# Patient Record
Sex: Male | Born: 1950 | Race: Black or African American | Hispanic: No | Marital: Single | Smoking: Current every day smoker
Health system: Southern US, Community
[De-identification: ages and names within clinical notes are randomized; demographics above are authoritative.]

## PROBLEM LIST (undated history)

## (undated) DIAGNOSIS — K219 Gastro-esophageal reflux disease without esophagitis: Secondary | ICD-10-CM

## (undated) DIAGNOSIS — J449 Chronic obstructive pulmonary disease, unspecified: Secondary | ICD-10-CM

## (undated) DIAGNOSIS — M1990 Unspecified osteoarthritis, unspecified site: Secondary | ICD-10-CM

## (undated) DIAGNOSIS — J111 Influenza due to unidentified influenza virus with other respiratory manifestations: Secondary | ICD-10-CM

## (undated) DIAGNOSIS — F17200 Nicotine dependence, unspecified, uncomplicated: Secondary | ICD-10-CM

## (undated) DIAGNOSIS — G4730 Sleep apnea, unspecified: Secondary | ICD-10-CM

## (undated) HISTORY — DX: Unspecified osteoarthritis, unspecified site: M19.90

## (undated) HISTORY — DX: Chronic obstructive pulmonary disease, unspecified: J44.9

## (undated) HISTORY — PX: NO PAST SURGERIES: SHX2092

## (undated) HISTORY — DX: Sleep apnea, unspecified: G47.30

## (undated) HISTORY — DX: Nicotine dependence, unspecified, uncomplicated: F17.200

## (undated) HISTORY — DX: Influenza due to unidentified influenza virus with other respiratory manifestations: J11.1

## (undated) HISTORY — DX: Gastro-esophageal reflux disease without esophagitis: K21.9

---

## 2017-07-16 ENCOUNTER — Emergency Department
Admission: EM | Admit: 2017-07-16 | Discharge: 2017-07-16 | Disposition: A | Discharge status: Home / Self Care | Payer: Medicare Other | Attending: Emergency Medicine | Admitting: Emergency Medicine

## 2017-07-16 ENCOUNTER — Encounter: Payer: Self-pay | Admitting: Emergency Medicine

## 2017-07-16 DIAGNOSIS — R220 Localized swelling, mass and lump, head: Secondary | ICD-10-CM | POA: Diagnosis present

## 2017-07-16 DIAGNOSIS — F17200 Nicotine dependence, unspecified, uncomplicated: Secondary | ICD-10-CM | POA: Insufficient documentation

## 2017-07-16 DIAGNOSIS — K047 Periapical abscess without sinus: Secondary | ICD-10-CM

## 2017-07-16 MED ORDER — LIDOCAINE-EPINEPHRINE 2 %-1:100000 IJ SOLN
INTRAMUSCULAR | Status: AC
  Administered 2017-07-16: 1.7 mL
  Filled 2017-07-16: qty 1.7

## 2017-07-16 MED ORDER — LIDOCAINE-EPINEPHRINE 2 %-1:100000 IJ SOLN
1.7000 mL | Freq: Once | INTRAMUSCULAR | Status: AC
  Administered 2017-07-16: 1.7 mL

## 2017-07-16 MED ORDER — HYDROCODONE-ACETAMINOPHEN 5-325 MG PO TABS
2.0000 | ORAL_TABLET | Freq: Once | ORAL | Status: AC
  Administered 2017-07-16: 2 via ORAL
  Filled 2017-07-16: qty 2

## 2017-07-16 MED ORDER — CLINDAMYCIN HCL 300 MG PO CAPS: 300.0000 mg | ORAL_CAPSULE | Freq: Four times a day (QID) | ORAL | 0 refills | Status: AC

## 2017-07-16 MED ORDER — LIDOCAINE-EPINEPHRINE 2 %-1:100000 IJ SOLN
INTRAMUSCULAR | Status: AC
  Filled 2017-07-16: qty 1.7

## 2017-07-16 MED ORDER — CLINDAMYCIN HCL 150 MG PO CAPS
300.0000 mg | ORAL_CAPSULE | Freq: Once | ORAL | Status: AC
  Administered 2017-07-16: 300 mg via ORAL
  Filled 2017-07-16: qty 2

## 2017-07-16 MED ORDER — HYDROCODONE-ACETAMINOPHEN 5-325 MG PO TABS: 1.0000 | ORAL_TABLET | ORAL | 0 refills | Status: AC | PRN

## 2017-07-16 NOTE — ED Triage Notes (Signed)
Pt to ed with c/o pain to right side of face and facial swelling, reports started abx 2 days ago.

## 2017-07-16 NOTE — ED Provider Notes (Signed)
[facility] Emergency Department Provider Note  Time seen: 7:18 PM  I have reviewed the triage vital signs and the nursing notes.   HISTORY  Chief Complaint Facial Swelling    HPI Juan [name] is a 66 y.o. male Who presents to the emergency department for right facial swelling. According to the patient for the past few days he has been experiencing pain in his right lower molar. Went to urgent care and was prescribed penicillin yesterday. Patient states he awoke this morning with significant swelling to the right lower face which appeared to worsen today so he came to the ER for evaluation. Denies any trouble swallowing or breathing besides pain with chewing. Denies fever. Denies vomiting.describes his pain as moderate. Dull pain.  History reviewed. No pertinent past medical history.  There are no active problems to display for this patient.   History reviewed. No pertinent surgical history.  Prior to Admission medications   Not on File    No Known Allergies  No family history on file.  Social History Social History  Substance Use Topics  . Smoking status: Current Every Day Smoker  . Smokeless tobacco: Never Used  . Alcohol use Yes    Review of Systems Constitutional: Negative for fever. ENT: right lower molar pain/right lower face pain and swelling Cardiovascular: Negative for chest pain. Respiratory: Negative for shortness of breath. Gastrointestinal: Negative for abdominal pain, vomiting Musculoskeletal: Negative for back pain. Neurological: Negative for headache All other ROS negative  ____________________________________________   PHYSICAL EXAM:  VITAL SIGNS: ED Triage Vitals  Enc Vitals Group     BP --      Pulse --      Resp 07/16/17 1808 18     Temp --      Temp Source 07/16/17 1808 Oral     SpO2 --      Weight 07/16/17 1808 165 lb (74.8 kg)     Height 07/16/17 1808 5\' 8"  (1.727 m)     Head Circumference --      Peak Flow  --      Pain Score 07/16/17 1807 2     Pain Loc --      Pain Edu? --      Excl. in [place]? --     Constitutional: Alert and oriented. Well appearing and in no distress. Eyes: Normal exam ENT   Head: Normocephalic and atraumatic.   Mouth/Throat: Mucous membranes are moist.patient has tenderness to the right lower molar. Moderate swelling to the right lower face. Floor the mouth is soft and nontender. Does not appear to extend below the mandibular line. Cardiovascular: Normal rate, regular rhythm. No murmur Respiratory: Normal respiratory effort without tachypnea nor retractions. Breath sounds are clear  Gastrointestinal: Soft and nontender. No distention Musculoskeletal: Nontender with normal range of motion in all extremities.  Neurologic:  Normal speech and language. No gross focal neurologic deficits Skin:  Skin is warm, dry and intact.  Psychiatric: Mood and affect are normal.   ____________________________________________   INITIAL IMPRESSION / ASSESSMENT AND PLAN / ED COURSE  Pertinent labs & imaging results that were available during my care of the patient were reviewed by me and considered in my medical decision making (see chart for details).  patient presents to the emergency department for right facial swelling after several days of right molar pain. Exam is very consistent with dental abscess. I performed an inferior alveolar block attempted to drain with needle aspiration, unable to drain any pus from  the area. I ultrasounded the area at bedside and appears to show more of a cellulitis picture but no discernible pocket of exudate that could be drained yet. However as the patient now has a widely dental abscess we will place the patient on clindamycin opposed to penicillin. We will discharge with pain medication. The patient states they will call the dentist tomorrow morning. I discussed return precautions for any worsening swelling fever or vomiting unable to keep down  antibiotics.  ____________________________________________   FINAL CLINICAL IMPRESSION(S) / ED DIAGNOSES  right facial swelling    Harvest Dark, MD 07/16/17 1921

## 2017-07-16 NOTE — Discharge Instructions (Signed)
return to the emergency department for any increase in facial swelling, any difficulty breathing, fever, or if you are unable to keep down antibiotics due to vomiting.

## 2017-07-30 ENCOUNTER — Emergency Department
Admission: EM | Admit: 2017-07-30 | Discharge: 2017-07-30 | Disposition: A | Discharge status: Home / Self Care | Payer: Medicare Other | Attending: Emergency Medicine | Admitting: Emergency Medicine

## 2017-07-30 ENCOUNTER — Encounter: Payer: Self-pay | Admitting: Emergency Medicine

## 2017-07-30 DIAGNOSIS — F17200 Nicotine dependence, unspecified, uncomplicated: Secondary | ICD-10-CM | POA: Diagnosis not present

## 2017-07-30 DIAGNOSIS — R109 Unspecified abdominal pain: Secondary | ICD-10-CM

## 2017-07-30 DIAGNOSIS — K2970 Gastritis, unspecified, without bleeding: Secondary | ICD-10-CM

## 2017-07-30 LAB — CBC
HCT: 38.2 % — ABNORMAL LOW (ref 40.0–52.0)
HEMOGLOBIN: 13.6 g/dL (ref 13.0–18.0)
MCH: 34.4 pg — AB (ref 26.0–34.0)
MCHC: 35.6 g/dL (ref 32.0–36.0)
MCV: 96.5 fL (ref 80.0–100.0)
Platelets: 220 10*3/uL (ref 150–440)
RBC: 3.96 MIL/uL — AB (ref 4.40–5.90)
RDW: 15 % — ABNORMAL HIGH (ref 11.5–14.5)
WBC: 7 10*3/uL (ref 3.8–10.6)

## 2017-07-30 LAB — URINALYSIS, COMPLETE (UACMP) WITH MICROSCOPIC
Bacteria, UA: NONE SEEN
Bilirubin Urine: NEGATIVE
Glucose, UA: NEGATIVE mg/dL
KETONES UR: NEGATIVE mg/dL
Leukocytes, UA: NEGATIVE
Nitrite: NEGATIVE
PROTEIN: NEGATIVE mg/dL
SQUAMOUS EPITHELIAL / LPF: NONE SEEN
Specific Gravity, Urine: 1.02 (ref 1.005–1.030)
pH: 6 (ref 5.0–8.0)

## 2017-07-30 LAB — COMPREHENSIVE METABOLIC PANEL
ALBUMIN: 3.5 g/dL (ref 3.5–5.0)
ALT: 26 U/L (ref 17–63)
ANION GAP: 5 (ref 5–15)
AST: 21 U/L (ref 15–41)
Alkaline Phosphatase: 60 U/L (ref 38–126)
BUN: 18 mg/dL (ref 6–20)
CHLORIDE: 106 mmol/L (ref 101–111)
CO2: 27 mmol/L (ref 22–32)
CREATININE: 0.68 mg/dL (ref 0.61–1.24)
Calcium: 9.4 mg/dL (ref 8.9–10.3)
GFR calc non Af Amer: >60 mL/min (ref >60)
GLUCOSE: 126 mg/dL — AB (ref 65–99)
Potassium: 3.7 mmol/L (ref 3.5–5.1)
SODIUM: 138 mmol/L (ref 135–145)
Total Bilirubin: 0.6 mg/dL (ref 0.3–1.2)
Total Protein: 7.1 g/dL (ref 6.5–8.1)

## 2017-07-30 LAB — LIPASE, BLOOD: LIPASE: 25 U/L (ref 11–51)

## 2017-07-30 MED ORDER — SUCRALFATE 1 G PO TABS: 1.0000 g | ORAL_TABLET | Freq: Four times a day (QID) | ORAL | 0 refills | Status: DC

## 2017-07-30 MED ORDER — FAMOTIDINE 40 MG PO TABS
40.0000 mg | ORAL_TABLET | Freq: Every evening | ORAL | 1 refills | Status: DC
Start: 2017-07-30 — End: 2018-09-20

## 2017-07-30 NOTE — ED Triage Notes (Signed)
Pt reports generalized abdominal pain every time he eats for several months. Pt reports feeling a gnawing sensation in his abdomen after eating. Denies NVD. Denies blood in stool. Pt ambulatory to triage, no apparent distress noted. Pt reports pain is becoming more frequent so he decided to come today.

## 2017-07-30 NOTE — Discharge Instructions (Signed)
Please seek medical attention for any high fevers, chest pain, shortness of breath, change in behavior, persistent vomiting, bloody stool or any other new or concerning symptoms.  

## 2017-07-30 NOTE — ED Provider Notes (Signed)
[facility] Emergency Department Provider Note  ____________________________________________   I have reviewed the triage vital signs and the nursing notes.   HISTORY  Chief Complaint Abdominal Pain   History limited by: Not Limited   HPI Juan [name] is a 66 y.o. male who presents to the emergency department today because of concerns for abdominal pain. Is located in the upper mid abdomen. It started a few days ago. It is worse after he eats. He feels like it is a gnawing within his stomach. It will become severe after he notes. He states he has started antibiotics recently for a dental infection and has been lying down shortly after taking them and has not been drinking water with them as are the instructions on the bottle. He denies similar pain in the past. He denies any fevers. Denies any change in defecation.  History reviewed. No pertinent past medical history.  There are no active problems to display for this patient.   No past surgical history on file.  Prior to Admission medications   Medication Sig Start Date End Date Taking? Authorizing Provider  HYDROcodone-acetaminophen (NORCO/VICODIN) 5-325 MG tablet Take 1 tablet by mouth every 4 (four) hours as needed for moderate pain. 07/16/17 07/16/18  Harvest Dark, MD    Allergies Patient has no known allergies.  No family history on file.  Social History Social History  Substance Use Topics  . Smoking status: Current Every Day Smoker  . Smokeless tobacco: Never Used  . Alcohol use Yes    Review of Systems Constitutional: No fever/chills Eyes: No visual changes. ENT: No sore throat. Cardiovascular: Denies chest pain. Respiratory: Denies shortness of breath. Gastrointestinal: Positive for abdominal pain.  Genitourinary: Negative for dysuria. Musculoskeletal: Negative for back pain. Skin: Negative for rash. Neurological: Negative for headaches, focal weakness or  numbness.  ____________________________________________   PHYSICAL EXAM:  VITAL SIGNS: ED Triage Vitals  Enc Vitals Group     BP 07/30/17 1138 109/73     Pulse Rate 07/30/17 1138 72     Resp 07/30/17 1138 18     Temp 07/30/17 1138 98.3 F (36.8 C)     Temp Source 07/30/17 1138 Oral     SpO2 07/30/17 1138 98 %     Weight 07/30/17 1140 165 lb (74.8 kg)     Height 07/30/17 1140 5' 8.5" (1.74 m)     Head Circumference --      Peak Flow --      Pain Score 07/30/17 1139 5    Constitutional: Alert and oriented. Well appearing and in no distress. Eyes: Conjunctivae are normal.  ENT   Head: Normocephalic and atraumatic.   Nose: No congestion/rhinnorhea.   Mouth/Throat: Mucous membranes are moist.   Neck: No stridor. Hematological/Lymphatic/Immunilogical: No cervical lymphadenopathy. Cardiovascular: Normal rate, regular rhythm.  No murmurs, rubs, or gallops.  Respiratory: Normal respiratory effort without tachypnea nor retractions. Breath sounds are clear and equal bilaterally. No wheezes/rales/rhonchi. Gastrointestinal: Soft and non tender. No rebound. No guarding.  Genitourinary: Deferred Musculoskeletal: Normal range of motion in all extremities. No lower extremity edema. Neurologic:  Normal speech and language. No gross focal neurologic deficits are appreciated.  Skin:  Skin is warm, dry and intact. No rash noted. Psychiatric: Mood and affect are normal. Speech and behavior are normal. Patient exhibits appropriate insight and judgment.  ____________________________________________    LABS (pertinent positives/negatives)  CBC WBC 7.0 Hgb 13.6 Platelets 220  CMP Cr 0.68 K 3.7 Alk phos 60 AST 21 ALT  26 T bili 0.6 Lipase 25  ____________________________________________   EKG  None  ____________________________________________     RADIOLOGY  None ____________________________________________   PROCEDURES  Procedures  ____________________________________________   INITIAL IMPRESSION / ASSESSMENT AND PLAN / ED COURSE  Pertinent labs & imaging results that were available during my care of the patient were reviewed by me and considered in my medical decision making (see chart for details).  patient presents to the emergency department today with concerns for abdominal pain. On exam his abdomen is benign. No concerning findings on blood work. At this point I think gastritis likely given her recent new antibiotic use. Will trial patient on antacids and sucralfate. Discussed with patient return precautions. Did discuss possibility of gallbladder disease however given lack of tenderness in the right upper quadrant and no concerning findings on blood work that this is a lesser probability.  ____________________________________________   FINAL CLINICAL IMPRESSION(S) / ED DIAGNOSES  Final diagnoses:  Abdominal pain, unspecified abdominal location  Gastritis, presence of bleeding unspecified, unspecified chronicity, unspecified gastritis type     Note: This dictation was prepared with Dragon dictation. Any transcriptional errors that result from this process are unintentional     Nance Pear, MD 07/30/17 1526

## 2018-01-08 ENCOUNTER — Telehealth: Payer: Self-pay | Admitting: *Deleted

## 2018-01-08 DIAGNOSIS — Z122 Encounter for screening for malignant neoplasm of respiratory organs: Secondary | ICD-10-CM

## 2018-01-08 DIAGNOSIS — Z87891 Personal history of nicotine dependence: Secondary | ICD-10-CM

## 2018-01-08 NOTE — Telephone Encounter (Signed)
Received referral for initial lung cancer screening scan. Contacted patient and obtained smoking history,(current, 39.75 pack year) as well as answering questions related to screening process. Patient denies signs of lung cancer such as weight loss or hemoptysis. Patient denies comorbidity that would prevent curative treatment if lung cancer were found. Patient is scheduled for shared decision making visit and CT scan on 01/28/18.

## 2018-01-25 ENCOUNTER — Encounter: Payer: Self-pay | Admitting: *Deleted

## 2018-01-28 ENCOUNTER — Ambulatory Visit
Admission: RE | Admit: 2018-01-28 | Discharge: 2018-01-28 | Disposition: A | Discharge status: Home / Self Care | Payer: Medicare Other | Source: Ambulatory Visit | Attending: Nurse Practitioner | Admitting: Nurse Practitioner

## 2018-01-28 ENCOUNTER — Inpatient Hospital Stay: Payer: Medicare Other | Attending: Oncology | Admitting: Oncology

## 2018-01-28 DIAGNOSIS — Z122 Encounter for screening for malignant neoplasm of respiratory organs: Secondary | ICD-10-CM | POA: Diagnosis not present

## 2018-01-28 DIAGNOSIS — Z87891 Personal history of nicotine dependence: Secondary | ICD-10-CM

## 2018-01-28 DIAGNOSIS — R918 Other nonspecific abnormal finding of lung field: Secondary | ICD-10-CM | POA: Diagnosis not present

## 2018-01-28 DIAGNOSIS — I700 Atherosclerosis of aorta: Secondary | ICD-10-CM | POA: Diagnosis not present

## 2018-01-28 DIAGNOSIS — F17210 Nicotine dependence, cigarettes, uncomplicated: Secondary | ICD-10-CM

## 2018-01-28 NOTE — Progress Notes (Signed)
In accordance with CMS guidelines, patient has met eligibility criteria including age, absence of signs or symptoms of lung cancer.  Social History   Tobacco Use  . Smoking status: Current Every Day Smoker    Packs/day: 0.75    Years: 53.00    Pack years: 39.75  . Smokeless tobacco: Never Used  Substance Use Topics  . Alcohol use: Yes  . Drug use: No     A shared decision-making session was conducted prior to the performance of CT scan. This includes one or more decision aids, includes benefits and harms of screening, follow-up diagnostic testing, over-diagnosis, false positive rate, and total radiation exposure.  Counseling on the importance of adherence to annual lung cancer LDCT screening, impact of co-morbidities, and ability or willingness to undergo diagnosis and treatment is imperative for compliance of the program.  Counseling on the importance of continued smoking cessation for former smokers; the importance of smoking cessation for current smokers, and information about tobacco cessation interventions have been given to patient including [place] and 1800 quit  programs.  Written order for lung cancer screening with LDCT has been given to the patient and any and all questions have been answered to the best of my abilities.   Yearly follow up will be coordinated by Burgess Estelle, Thoracic Navigator.  Faythe Casa, NP 01/28/2018 9:42 AM

## 2018-01-29 ENCOUNTER — Telehealth: Payer: Self-pay | Admitting: *Deleted

## 2018-01-29 ENCOUNTER — Other Ambulatory Visit: Payer: Self-pay | Admitting: Nurse Practitioner

## 2018-01-29 DIAGNOSIS — Z87891 Personal history of nicotine dependence: Secondary | ICD-10-CM

## 2018-01-29 NOTE — Telephone Encounter (Signed)
Notified patient of LDCT lung cancer screening program results with recommendation for 3 month follow up imaging. Also notified of incidental findings noted below and is encouraged to discuss further with PCP who will receive a copy of this note and/or the CT report. Patient verbalizes understanding. PCP was notified by Beckey Rutter NP today.  IMPRESSION: 1. Lung-RADS 0-S, incomplete. Patchy peribronchovascular consolidation, nodularity and ground-glass opacity throughout the anterior right lower lobe, most compatible with bronchopneumonia. Repeat low-dose chest CT without contrast in 3 months (please use the following order, "CT CHEST LCS NODULE FOLLOW-UP W/O CM") is recommended. 2. The "S" modifier above refers to potentially clinically significant non lung cancer related findings. Specifically, mild right paratracheal adenopathy, nonspecific, which can also be reassessed on follow-up chest CT in 3 months.  Aortic Atherosclerosis (ICD10-I70.0).  These results will be called to the ordering clinician or representative by the Radiologist Assistant, and communication documented in the PACS or zVision Dashboard.

## 2018-01-29 NOTE — Progress Notes (Signed)
Low-dose CT lung screening revealed patchy peribronchovascular consolidation, nodularity, and groundglass opacity throughout the anterior right lower lobe most compatible with bronchopneumonia.  Recommendation for repeat low-dose chest CT without contrast in 3 months.  Discussed finding with patient's primary care doctor Register.  Dr. Gwendolyn Fill to discuss findings with patient and treat.  I have placed order for follow-up scan in 3 months.  Will fax copy of report to PCP.

## 2018-04-22 ENCOUNTER — Telehealth: Payer: Self-pay | Admitting: *Deleted

## 2018-04-22 DIAGNOSIS — R918 Other nonspecific abnormal finding of lung field: Secondary | ICD-10-CM

## 2018-04-22 DIAGNOSIS — Z87891 Personal history of nicotine dependence: Secondary | ICD-10-CM

## 2018-04-22 DIAGNOSIS — Z122 Encounter for screening for malignant neoplasm of respiratory organs: Secondary | ICD-10-CM

## 2018-04-22 NOTE — Telephone Encounter (Signed)
Notified patient that lung cancer screening short term follow up CT scan is due currently or will be in near future. Confirmed that patient is within the age range of 55-77, and asymptomatic, (no signs or symptoms of lung cancer). Patient denies illness that would prevent curative treatment for lung cancer if found. Verified smoking history, (current, 39.75 pack year). The shared decision making visit was done 01/28/18. Patient is agreeable for CT scan being scheduled.

## 2018-05-06 ENCOUNTER — Ambulatory Visit
Admission: RE | Admit: 2018-05-06 | Discharge: 2018-05-06 | Disposition: A | Discharge status: Home / Self Care | Payer: Medicare Other | Source: Ambulatory Visit | Attending: Nurse Practitioner | Admitting: Nurse Practitioner

## 2018-05-06 DIAGNOSIS — I700 Atherosclerosis of aorta: Secondary | ICD-10-CM | POA: Insufficient documentation

## 2018-05-06 DIAGNOSIS — J432 Centrilobular emphysema: Secondary | ICD-10-CM | POA: Insufficient documentation

## 2018-05-06 DIAGNOSIS — Z87891 Personal history of nicotine dependence: Secondary | ICD-10-CM | POA: Diagnosis present

## 2018-05-06 DIAGNOSIS — Z122 Encounter for screening for malignant neoplasm of respiratory organs: Secondary | ICD-10-CM

## 2018-05-06 DIAGNOSIS — J438 Other emphysema: Secondary | ICD-10-CM | POA: Diagnosis not present

## 2018-05-06 DIAGNOSIS — R918 Other nonspecific abnormal finding of lung field: Secondary | ICD-10-CM

## 2018-05-13 ENCOUNTER — Encounter: Payer: Self-pay | Admitting: *Deleted

## 2018-07-17 ENCOUNTER — Encounter: Payer: Self-pay | Admitting: Emergency Medicine

## 2018-07-17 ENCOUNTER — Other Ambulatory Visit: Payer: Self-pay

## 2018-07-17 ENCOUNTER — Emergency Department: Payer: No Typology Code available for payment source

## 2018-07-17 ENCOUNTER — Emergency Department
Admission: EM | Admit: 2018-07-17 | Discharge: 2018-07-18 | Disposition: A | Discharge status: Home / Self Care | Payer: No Typology Code available for payment source | Attending: Student in an Organized Health Care Education/Training Program | Admitting: Student in an Organized Health Care Education/Training Program

## 2018-07-17 DIAGNOSIS — F17210 Nicotine dependence, cigarettes, uncomplicated: Secondary | ICD-10-CM | POA: Insufficient documentation

## 2018-07-17 DIAGNOSIS — R1084 Generalized abdominal pain: Secondary | ICD-10-CM | POA: Diagnosis not present

## 2018-07-17 DIAGNOSIS — Y999 Unspecified external cause status: Secondary | ICD-10-CM | POA: Insufficient documentation

## 2018-07-17 DIAGNOSIS — R109 Unspecified abdominal pain: Secondary | ICD-10-CM

## 2018-07-17 DIAGNOSIS — S0990XA Unspecified injury of head, initial encounter: Secondary | ICD-10-CM | POA: Diagnosis not present

## 2018-07-17 DIAGNOSIS — Y92410 Unspecified street and highway as the place of occurrence of the external cause: Secondary | ICD-10-CM | POA: Diagnosis not present

## 2018-07-17 DIAGNOSIS — Y9389 Activity, other specified: Secondary | ICD-10-CM | POA: Insufficient documentation

## 2018-07-17 LAB — COMPREHENSIVE METABOLIC PANEL
ALBUMIN: 3.6 g/dL (ref 3.5–5.0)
ALT: 19 U/L (ref 0–44)
ANION GAP: 6 (ref 5–15)
AST: 34 U/L (ref 15–41)
Alkaline Phosphatase: 57 U/L (ref 38–126)
BILIRUBIN TOTAL: 0.7 mg/dL (ref 0.3–1.2)
BUN: 16 mg/dL (ref 8–23)
CO2: 26 mmol/L (ref 22–32)
Calcium: 8.8 mg/dL — ABNORMAL LOW (ref 8.9–10.3)
Chloride: 108 mmol/L (ref 98–111)
Creatinine, Ser: 0.78 mg/dL (ref 0.61–1.24)
GFR calc non Af Amer: >60 mL/min (ref >60)
GLUCOSE: 152 mg/dL — AB (ref 70–99)
POTASSIUM: 3.5 mmol/L (ref 3.5–5.1)
SODIUM: 140 mmol/L (ref 135–145)
TOTAL PROTEIN: 6.8 g/dL (ref 6.5–8.1)

## 2018-07-17 LAB — CBC WITH DIFFERENTIAL/PLATELET
BASOS PCT: 1 %
Basophils Absolute: 0 10*3/uL (ref 0–0.1)
EOS ABS: 0.1 10*3/uL (ref 0–0.7)
Eosinophils Relative: 2 %
HCT: 38.9 % — ABNORMAL LOW (ref 40.0–52.0)
Hemoglobin: 13.3 g/dL (ref 13.0–18.0)
Lymphocytes Relative: 27 %
Lymphs Abs: 1.7 10*3/uL (ref 1.0–3.6)
MCH: 33.5 pg (ref 26.0–34.0)
MCHC: 34.3 g/dL (ref 32.0–36.0)
MCV: 97.6 fL (ref 80.0–100.0)
MONO ABS: 0.8 10*3/uL (ref 0.2–1.0)
MONOS PCT: 12 %
Neutro Abs: 3.7 10*3/uL (ref 1.4–6.5)
Neutrophils Relative %: 58 %
Platelets: 143 10*3/uL — ABNORMAL LOW (ref 150–440)
RBC: 3.98 MIL/uL — ABNORMAL LOW (ref 4.40–5.90)
RDW: 15.2 % — AB (ref 11.5–14.5)
WBC: 6.3 10*3/uL (ref 3.8–10.6)

## 2018-07-17 LAB — TROPONIN I: Troponin I: <0.03 ng/mL (ref <0.03)

## 2018-07-17 MED ORDER — IOPAMIDOL (ISOVUE-300) INJECTION 61%
100.0000 mL | Freq: Once | INTRAVENOUS | Status: AC | PRN
  Administered 2018-07-17: 100 mL via INTRAVENOUS

## 2018-07-17 MED ORDER — SODIUM CHLORIDE 0.9 % IV BOLUS
1000.0000 mL | Freq: Once | INTRAVENOUS | Status: AC
  Administered 2018-07-18: 1000 mL via INTRAVENOUS
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
## 2018-07-17 NOTE — ED Notes (Signed)
Report given to Allison.RN
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
## 2018-07-18 DIAGNOSIS — S0990XA Unspecified injury of head, initial encounter: Secondary | ICD-10-CM | POA: Diagnosis not present

## 2018-07-18 MED ORDER — KETOROLAC TROMETHAMINE 30 MG/ML IJ SOLN
10.0000 mg | Freq: Once | INTRAMUSCULAR | Status: AC
  Administered 2018-07-18: 9.9 mg via INTRAVENOUS
  Filled 2018-07-18: qty 1

## 2018-07-18 MED ORDER — IBUPROFEN 600 MG PO TABS: 600.0000 mg | ORAL_TABLET | Freq: Three times a day (TID) | ORAL | 0 refills | Status: DC | PRN

## 2018-07-18 MED ORDER — HYDROCODONE-ACETAMINOPHEN 5-325 MG PO TABS
1.0000 | ORAL_TABLET | Freq: Once | ORAL | Status: AC
  Administered 2018-07-18: 1 via ORAL
  Filled 2018-07-18: qty 1

## 2018-07-18 MED ORDER — HYDROCODONE-ACETAMINOPHEN 5-325 MG PO TABS: 1.0000 | ORAL_TABLET | Freq: Four times a day (QID) | ORAL | 0 refills | Status: DC | PRN

## 2018-07-18 NOTE — ED Provider Notes (Signed)
-----------------------------------------   12:59 AM on 07/18/2018 -----------------------------------------  CT head and cervical spine interpreted per Dr. Randel Pigg: Normal head CT.    No acute cervical spine fracture or listhesis.   CT chest/abdomen/pelvis interpreted per Dr. Gerilyn Nestle: 1. No acute posttraumatic changes demonstrated in the chest,  abdomen, or pelvis. No evidence of mediastinal or vascular injury.  No evidence of pulmonary contusion or pneumothorax. No evidence of  solid organ injury. No free fluid or free air.  2. Marked enlargement of the prostate gland.   Patient resting in no acute distress.  Asking to eat his snack and something for pain for his soreness.  Updated patient and family member of CT imaging results.  Will discharge home with prescriptions for Motrin and Norco to take as needed.  He will follow-up with his PCP closely.  Strict return precautions given.  Both verbalize understanding and agree with plan of care.   Paulette Blanch, MD 07/18/18 [phone]

## 2018-07-18 NOTE — Discharge Instructions (Addendum)
1.  You may take pain medicines as needed (Motrin/Norco #15). 2.  Apply ice to affected area several times daily. 3.  Return to the ER for worsening symptoms, persistent vomiting, lethargy, difficulty breathing or other concerns.

## 2018-07-18 NOTE — ED Notes (Signed)
Pt. Verbalizes understanding of d/c instructions, medications, and follow-up. VS stable.  Pt. In NAD at time of d/c and denies further concerns regarding this visit. Pt. Stable at the time of departure from the unit, departing unit by the safest and most appropriate manner per that pt condition and limitations with all belongings accounted for. Pt advised to return to the ED at any time for emergent concerns, or for new/worsening symptoms.   

## 2018-09-09 ENCOUNTER — Other Ambulatory Visit: Payer: Self-pay | Admitting: Family Medicine

## 2018-09-09 DIAGNOSIS — R109 Unspecified abdominal pain: Secondary | ICD-10-CM

## 2018-09-10 ENCOUNTER — Other Ambulatory Visit: Payer: Self-pay | Admitting: Family Medicine

## 2018-09-10 DIAGNOSIS — R109 Unspecified abdominal pain: Secondary | ICD-10-CM

## 2018-09-16 ENCOUNTER — Ambulatory Visit
Admission: RE | Admit: 2018-09-16 | Discharge: 2018-09-16 | Disposition: A | Discharge status: Home / Self Care | Payer: Medicare HMO | Source: Ambulatory Visit | Attending: Family Medicine | Admitting: Family Medicine

## 2018-09-16 DIAGNOSIS — R109 Unspecified abdominal pain: Secondary | ICD-10-CM | POA: Diagnosis present

## 2018-09-20 ENCOUNTER — Encounter: Payer: Self-pay | Admitting: Internal Medicine

## 2018-09-20 ENCOUNTER — Ambulatory Visit (INDEPENDENT_AMBULATORY_CARE_PROVIDER_SITE_OTHER): Payer: Medicare HMO

## 2018-09-20 ENCOUNTER — Ambulatory Visit (INDEPENDENT_AMBULATORY_CARE_PROVIDER_SITE_OTHER): Payer: Medicare HMO | Admitting: Internal Medicine

## 2018-09-20 VITALS — BP 118/70 | HR 69 | Temp 98.0°F | Resp 16 | Ht 68.0 in | Wt 168.5 lb

## 2018-09-20 DIAGNOSIS — M5441 Lumbago with sciatica, right side: Secondary | ICD-10-CM | POA: Diagnosis not present

## 2018-09-20 DIAGNOSIS — Z1159 Encounter for screening for other viral diseases: Secondary | ICD-10-CM

## 2018-09-20 DIAGNOSIS — R0781 Pleurodynia: Secondary | ICD-10-CM | POA: Diagnosis not present

## 2018-09-20 DIAGNOSIS — R739 Hyperglycemia, unspecified: Secondary | ICD-10-CM

## 2018-09-20 DIAGNOSIS — N400 Enlarged prostate without lower urinary tract symptoms: Secondary | ICD-10-CM

## 2018-09-20 DIAGNOSIS — Z1329 Encounter for screening for other suspected endocrine disorder: Secondary | ICD-10-CM

## 2018-09-20 DIAGNOSIS — M79671 Pain in right foot: Secondary | ICD-10-CM

## 2018-09-20 DIAGNOSIS — Z13818 Encounter for screening for other digestive system disorders: Secondary | ICD-10-CM

## 2018-09-20 DIAGNOSIS — Z1389 Encounter for screening for other disorder: Secondary | ICD-10-CM

## 2018-09-20 DIAGNOSIS — D696 Thrombocytopenia, unspecified: Secondary | ICD-10-CM

## 2018-09-20 DIAGNOSIS — E559 Vitamin D deficiency, unspecified: Secondary | ICD-10-CM

## 2018-09-20 DIAGNOSIS — M79604 Pain in right leg: Secondary | ICD-10-CM | POA: Insufficient documentation

## 2018-09-20 DIAGNOSIS — Z13220 Encounter for screening for lipoid disorders: Secondary | ICD-10-CM

## 2018-09-20 DIAGNOSIS — Z125 Encounter for screening for malignant neoplasm of prostate: Secondary | ICD-10-CM

## 2018-09-20 MED ORDER — MELOXICAM 7.5 MG PO TABS: 7.5000 mg | ORAL_TABLET | Freq: Every day | ORAL | 1 refills | Status: DC

## 2018-09-20 MED ORDER — TRAMADOL HCL 50 MG PO TABS: 50.0000 mg | ORAL_TABLET | Freq: Two times a day (BID) | ORAL | 0 refills | Status: DC | PRN

## 2018-09-20 MED ORDER — CYCLOBENZAPRINE HCL 5 MG PO TABS: 5.0000 mg | ORAL_TABLET | Freq: Every evening | ORAL | 1 refills | Status: DC | PRN
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
## 2018-09-20 NOTE — Patient Instructions (Addendum)
Consider Tdap vaccine if not had  Miralax or Senna/colace laxative if needed for constipation  Warm prune juice     Benign Prostatic Hyperplasia Benign prostatic hyperplasia (BPH) is an enlarged prostate gland that is caused by the normal aging process and not by cancer. The prostate is a walnut-sized gland that is involved in the production of semen. It is located in front of the rectum and below the bladder. The bladder stores urine and the urethra is the tube that carries the urine out of the body. The prostate may get bigger as a man gets older. An enlarged prostate can press on the urethra. This can make it harder to pass urine. The build-up of urine in the bladder can cause infection. Back pressure and infection may progress to bladder damage and kidney (renal) failure. What are the causes? This condition is part of a normal aging process. However, not all men develop problems from this condition. If the prostate enlarges away from the urethra, urine flow will not be blocked. If it enlarges toward the urethra and compresses it, there will be problems passing urine. What increases the risk? This condition is more likely to develop in men over the age of 58 years. What are the signs or symptoms? Symptoms of this condition include:  Getting up often during the night to urinate.  Needing to urinate frequently during the day.  Difficulty starting urine flow.  Decrease in size and strength of your urine stream.  Leaking (dribbling) after urinating.  Inability to pass urine. This needs immediate treatment.  Inability to completely empty your bladder.  Pain when you pass urine. This is more common if there is also an infection.  Urinary tract infection (UTI).  How is this diagnosed? This condition is diagnosed based on your medical history, a physical exam, and your symptoms. Tests will also be done, such as:  A post-void bladder scan. This measures any amount of urine that may  remain in your bladder after you finish urinating.  A digital rectal exam. In a rectal exam, your health care provider checks your prostate by putting a lubricated, gloved finger into your rectum to feel the back of your prostate gland. This exam detects the size of your gland and any abnormal lumps or growths.  An exam of your urine (urinalysis).  A prostate specific antigen (PSA) screening. This is a blood test used to screen for prostate cancer.  An ultrasound. This test uses sound waves to electronically produce a picture of your prostate gland.  Your health care provider may refer you to a specialist in kidney and prostate diseases (urologist). How is this treated? Once symptoms begin, your health care provider will monitor your condition (active surveillance or watchful waiting). Treatment for this condition will depend on the severity of your condition. Treatment may include:  Observation and yearly exams. This may be the only treatment needed if your condition and symptoms are mild.  Medicines to relieve your symptoms, including: ? Medicines to shrink the prostate. ? Medicines to relax the muscle of the prostate.  Surgery in severe cases. Surgery may include: ? Prostatectomy. In this procedure, the prostate tissue is removed completely through an open incision or with a laparascope or robotics. ? Transurethral resection of the prostate (TURP). In this procedure, a tool is inserted through the opening at the tip of the penis (urethra). It is used to cut away tissue of the inner core of the prostate. The pieces are removed through the same  opening of the penis. This removes the blockage. ? Transurethral incision (TUIP). In this procedure, small cuts are made in the prostate. This lessens the prostate's pressure on the urethra. ? Transurethral microwave thermotherapy (TUMT). This procedure uses microwaves to create heat. The heat destroys and removes a small amount of prostate  tissue. ? Transurethral needle ablation (TUNA). This procedure uses radio frequencies to destroy and remove a small amount of prostate tissue. ? Interstitial laser coagulation ([place]oyle). This procedure uses a laser to destroy and remove a small amount of prostate tissue. ? Transurethral electrovaporization (TUVP). This procedure uses electrodes to destroy and remove a small amount of prostate tissue. ? Prostatic urethral lift. This procedure inserts an implant to push the lobes of the prostate away from the urethra.  Follow these instructions at home:  Take over-the-counter and prescription medicines only as told by your health care provider.  Monitor your symptoms for any changes. Contact your health care provider with any changes.  Avoid drinking large amounts of liquid before going to bed or out in public.  Avoid or reduce how much caffeine or alcohol you drink.  Give yourself time when you urinate.  Keep all follow-up visits as told by your health care provider. This is important. Contact a health care provider if:  You have unexplained back pain.  Your symptoms do not get better with treatment.  You develop side effects from the medicine you are taking.  Your urine becomes very dark or has a bad smell.  Your lower abdomen becomes distended and you have trouble passing your urine. Get help right away if:  You have a fever or chills.  You suddenly cannot urinate.  You feel lightheaded, or very dizzy, or you faint.  There are large amounts of blood or clots in the urine.  Your urinary problems become hard to manage.  You develop moderate to severe low back or flank pain. The flank is the side of your body between the ribs and the hip. These symptoms may represent a serious problem that is an emergency. Do not wait to see if the symptoms will go away. Get medical help right away. Call your local emergency services (911 in the [place] Do not drive yourself to the  hospital. Summary  Benign prostatic hyperplasia (BPH) is an enlarged prostate that is caused by the normal aging process and not by cancer.  An enlarged prostate can press on the urethra. This can make it hard to pass urine.  This condition is part of a normal aging process and is more likely to develop in men over the age of 6 years.  Get help right away if you suddenly cannot urinate. This information is not intended to replace advice given to you by your health care provider. Make sure you discuss any questions you have with your health care provider. Document Released: 10/30/2005 Document Revised: 12/04/2016 Document Reviewed: 12/04/2016 Elsevier Interactive Patient Education  2018 [place] is a term that is commonly used to refer to joint pain or joint disease. There are more than 100 types of arthritis. What are the causes? The most common cause of this condition is wear and tear of a joint. Other causes include:  Gout.  Inflammation of a joint.  An infection of a joint.  Sprains and other injuries near the joint.  A drug reaction or allergic reaction.  In some cases, the cause may not be known. What are the signs or symptoms? The main symptom  of this condition is pain in the joint with movement. Other symptoms include:  Redness, swelling, or stiffness at a joint.  Warmth coming from the joint.  Fever.  Overall feeling of illness.  How is this diagnosed? This condition may be diagnosed with a physical exam and tests, including:  Blood tests.  Urine tests.  Imaging tests, such as MRI, X-rays, or a CT scan.  Sometimes, fluid is removed from a joint for testing. How is this treated? Treatment for this condition may involve:  Treatment of the cause, if it is known.  Rest.  Raising (elevating) the joint.  Applying cold or hot packs to the joint.  Medicines to improve symptoms and reduce inflammation.  Injections of a steroid  such as cortisone into the joint to help reduce pain and inflammation.  Depending on the cause of your arthritis, you may need to make lifestyle changes to reduce stress on your joint. These changes may include exercising more and losing weight. Follow these instructions at home: Medicines  Take over-the-counter and prescription medicines only as told by your health care provider.  Do not take aspirin to relieve pain if gout is suspected. Activity  Rest your joint if told by your health care provider. Rest is important when your disease is active and your joint feels painful, swollen, or stiff.  Avoid activities that make the pain worse. It is important to balance activity with rest.  Exercise your joint regularly with range-of-motion exercises as told by your health care provider. Try doing low-impact exercise, such as: ? Swimming. ? Water aerobics. ? Biking. ? Walking. Joint Care   If your joint is swollen, keep it elevated if told by your health care provider.  If your joint feels stiff in the morning, try taking a warm shower.  If directed, apply heat to the joint. If you have diabetes, do not apply heat without permission from your health care provider. ? Put a towel between the joint and the hot pack or heating pad. ? Leave the heat on the area for 20-30 minutes.  If directed, apply ice to the joint: ? Put ice in a plastic bag. ? Place a towel between your skin and the bag. ? Leave the ice on for 20 minutes, 2-3 times per day.  Keep all follow-up visits as told by your health care provider. This is important. Contact a health care provider if:  The pain gets worse.  You have a fever. Get help right away if:  You develop severe joint pain, swelling, or redness.  Many joints become painful and swollen.  You develop severe back pain.  You develop severe weakness in your leg.  You cannot control your bladder or bowels. This information is not intended to replace  advice given to you by your health care provider. Make sure you discuss any questions you have with your health care provider. Document Released: 12/07/2004 Document Revised: 04/06/2016 Document Reviewed: 01/25/2015 Elsevier Interactive Patient Education  2018 [employer].   Constipation, Adult Constipation is when a person has fewer bowel movements in a week than normal, has difficulty having a bowel movement, or has stools that are dry, hard, or larger than normal. Constipation may be caused by an underlying condition. It may become worse with age if a person takes certain medicines and does not take in enough fluids. Follow these instructions at home: Eating and drinking   Eat foods that have a lot of fiber, such as fresh fruits and vegetables, whole  grains, and beans.  Limit foods that are high in fat, low in fiber, or overly processed, such as french fries, hamburgers, cookies, candies, and soda.  Drink enough fluid to keep your urine clear or pale yellow. General instructions  Exercise regularly or as told by your health care provider.  Go to the restroom when you have the urge to go. Do not hold it in.  Take over-the-counter and prescription medicines only as told by your health care provider. These include any fiber supplements.  Practice pelvic floor retraining exercises, such as deep breathing while relaxing the lower abdomen and pelvic floor relaxation during bowel movements.  Watch your condition for any changes.  Keep all follow-up visits as told by your health care provider. This is important. Contact a health care provider if:  You have pain that gets worse.  You have a fever.  You do not have a bowel movement after 4 days.  You vomit.  You are not hungry.  You lose weight.  You are bleeding from the anus.  You have thin, pencil-like stools. Get help right away if:  You have a fever and your symptoms suddenly get worse.  You leak stool or have blood  in your stool.  Your abdomen is bloated.  You have severe pain in your abdomen.  You feel dizzy or you faint. This information is not intended to replace advice given to you by your health care provider. Make sure you discuss any questions you have with your health care provider. Document Released: 07/28/2004 Document Revised: 05/19/2016 Document Reviewed: 04/19/2016 Elsevier Interactive Patient Education  2018 [employer].

## 2018-09-24 ENCOUNTER — Encounter: Payer: Self-pay | Admitting: Internal Medicine

## 2018-10-08 ENCOUNTER — Ambulatory Visit (INDEPENDENT_AMBULATORY_CARE_PROVIDER_SITE_OTHER): Payer: Medicare HMO | Admitting: Internal Medicine

## 2018-10-08 ENCOUNTER — Encounter: Payer: Self-pay | Admitting: Internal Medicine

## 2018-10-08 ENCOUNTER — Other Ambulatory Visit: Payer: Self-pay | Admitting: Internal Medicine

## 2018-10-08 VITALS — BP 104/78 | HR 64 | Temp 98.3°F | Ht 68.0 in | Wt 169.4 lb

## 2018-10-08 DIAGNOSIS — Z13220 Encounter for screening for lipoid disorders: Secondary | ICD-10-CM

## 2018-10-08 DIAGNOSIS — Z125 Encounter for screening for malignant neoplasm of prostate: Secondary | ICD-10-CM

## 2018-10-08 DIAGNOSIS — R918 Other nonspecific abnormal finding of lung field: Secondary | ICD-10-CM | POA: Insufficient documentation

## 2018-10-08 DIAGNOSIS — G8929 Other chronic pain: Secondary | ICD-10-CM | POA: Insufficient documentation

## 2018-10-08 DIAGNOSIS — J449 Chronic obstructive pulmonary disease, unspecified: Secondary | ICD-10-CM

## 2018-10-08 DIAGNOSIS — Z113 Encounter for screening for infections with a predominantly sexual mode of transmission: Secondary | ICD-10-CM

## 2018-10-08 DIAGNOSIS — D696 Thrombocytopenia, unspecified: Secondary | ICD-10-CM | POA: Diagnosis not present

## 2018-10-08 DIAGNOSIS — R269 Unspecified abnormalities of gait and mobility: Secondary | ICD-10-CM

## 2018-10-08 DIAGNOSIS — R911 Solitary pulmonary nodule: Secondary | ICD-10-CM

## 2018-10-08 DIAGNOSIS — E559 Vitamin D deficiency, unspecified: Secondary | ICD-10-CM

## 2018-10-08 DIAGNOSIS — R739 Hyperglycemia, unspecified: Secondary | ICD-10-CM

## 2018-10-08 DIAGNOSIS — N400 Enlarged prostate without lower urinary tract symptoms: Secondary | ICD-10-CM

## 2018-10-08 DIAGNOSIS — Z720 Tobacco use: Secondary | ICD-10-CM

## 2018-10-08 DIAGNOSIS — Z1159 Encounter for screening for other viral diseases: Secondary | ICD-10-CM

## 2018-10-08 DIAGNOSIS — M1990 Unspecified osteoarthritis, unspecified site: Secondary | ICD-10-CM

## 2018-10-08 DIAGNOSIS — M549 Dorsalgia, unspecified: Secondary | ICD-10-CM | POA: Diagnosis not present

## 2018-10-08 DIAGNOSIS — I7090 Unspecified atherosclerosis: Secondary | ICD-10-CM | POA: Insufficient documentation

## 2018-10-08 DIAGNOSIS — Z13818 Encounter for screening for other digestive system disorders: Secondary | ICD-10-CM

## 2018-10-08 DIAGNOSIS — Z1329 Encounter for screening for other suspected endocrine disorder: Secondary | ICD-10-CM

## 2018-10-08 DIAGNOSIS — J439 Emphysema, unspecified: Secondary | ICD-10-CM | POA: Insufficient documentation

## 2018-10-08 DIAGNOSIS — I2510 Atherosclerotic heart disease of native coronary artery without angina pectoris: Secondary | ICD-10-CM

## 2018-10-08 LAB — LIPID PANEL
Cholesterol: 209 mg/dL — ABNORMAL HIGH (ref 0–200)
HDL: 74.2 mg/dL (ref >39.00)
LDL CALC: 127 mg/dL — AB (ref 0–99)
NonHDL: 135.21
Total CHOL/HDL Ratio: 3
Triglycerides: 41 mg/dL (ref 0.0–149.0)
VLDL: 8.2 mg/dL (ref 0.0–40.0)

## 2018-10-08 LAB — PSA: PSA: 3.33 ng/mL (ref 0.10–4.00)

## 2018-10-08 LAB — HEMOGLOBIN A1C: HEMOGLOBIN A1C: 5.6 % (ref 4.6–6.5)

## 2018-10-08 LAB — VITAMIN D 25 HYDROXY (VIT D DEFICIENCY, FRACTURES): VITD: 18.06 ng/mL — ABNORMAL LOW (ref 30.00–100.00)

## 2018-10-08 LAB — TSH: TSH: 1.67 u[IU]/mL (ref 0.35–4.50)

## 2018-10-08 MED ORDER — CHOLECALCIFEROL 1.25 MG (50000 UT) PO CAPS: 50000.0000 [IU] | ORAL_CAPSULE | ORAL | 1 refills | Status: DC
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
## 2018-10-08 NOTE — Progress Notes (Signed)
Pre visit review using our clinic review tool, if applicable. No additional management support is needed unless otherwise documented below in the visit note. 
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
## 2018-10-09 ENCOUNTER — Telehealth: Payer: Self-pay | Admitting: Internal Medicine

## 2018-10-09 LAB — URINALYSIS, ROUTINE W REFLEX MICROSCOPIC
Bilirubin, UA: NEGATIVE
GLUCOSE, UA: NEGATIVE
KETONES UA: NEGATIVE
Leukocytes, UA: NEGATIVE
NITRITE UA: NEGATIVE
PROTEIN UA: NEGATIVE
SPEC GRAV UA: 1.024 (ref 1.005–1.030)
UUROB: 0.2 mg/dL (ref 0.2–1.0)
pH, UA: 5 (ref 5.0–7.5)

## 2018-10-09 LAB — MICROSCOPIC EXAMINATION
Casts: NONE SEEN /lpf
Epithelial Cells (non renal): NONE SEEN /hpf (ref 0–10)
WBC, UA: NONE SEEN /hpf (ref 0–5)

## 2018-10-09 LAB — HIV ANTIBODY (ROUTINE TESTING W REFLEX): HIV 1&2 Ab, 4th Generation: NONREACTIVE

## 2018-10-09 LAB — HEPATITIS B SURFACE ANTIGEN: HEP B S AG: NONREACTIVE

## 2018-10-09 LAB — HEPATITIS C ANTIBODY
Hepatitis C Ab: NONREACTIVE
SIGNAL TO CUT-OFF: 0.04 (ref <1.00)

## 2018-10-09 LAB — HEPATITIS B SURFACE ANTIBODY, QUANTITATIVE

## 2018-10-09 NOTE — Telephone Encounter (Signed)
Copied from [place] [phone]. Topic: Quick Communication - Lab Results (Clinic Use ONLY) >> Oct 09, 2018  2:23 PM Babs Bertin, CMA wrote: Called patient to inform them of [phone] lab results. When patient returns call, triage nurse may disclose results. >> Oct 09, 2018  3:24 PM Bea Graff, NT wrote: Pt returning call to get lab results.

## 2018-10-09 NOTE — Telephone Encounter (Signed)
Attempted CB; unable to leave message

## 2018-10-14 NOTE — Progress Notes (Signed)
No record at [facility] nor [facility]

## 2018-10-15 ENCOUNTER — Encounter: Payer: Self-pay | Admitting: Internal Medicine

## 2018-10-25 ENCOUNTER — Other Ambulatory Visit: Payer: Self-pay | Admitting: Internal Medicine

## 2018-10-25 DIAGNOSIS — J069 Acute upper respiratory infection, unspecified: Secondary | ICD-10-CM

## 2018-10-25 DIAGNOSIS — J329 Chronic sinusitis, unspecified: Secondary | ICD-10-CM

## 2018-10-25 MED ORDER — AZITHROMYCIN 250 MG PO TABS: ORAL_TABLET | ORAL | 0 refills | Status: DC

## 2018-10-28 ENCOUNTER — Telehealth: Payer: Self-pay | Admitting: Internal Medicine

## 2018-10-28 NOTE — Telephone Encounter (Signed)
Evaluated with Mrs. Juan [name] C/o cough, wheezing w/in the last week nothing tried. He is trying to cut back on smoking and reduced number of cigarettes as he has been a long term smoker   Will Rx Zpack today 10/25/18 if not better RTC. Reviewed CT chest 07/2018 no evidence of COPD Mild right lung field wheezing today Consider prn Albuterol inhaler in future    [place]

## 2018-11-08 ENCOUNTER — Ambulatory Visit
Admission: RE | Admit: 2018-11-08 | Discharge: 2018-11-08 | Disposition: A | Discharge status: Home / Self Care | Payer: Medicare HMO | Source: Ambulatory Visit | Attending: Internal Medicine | Admitting: Internal Medicine

## 2018-11-08 DIAGNOSIS — M549 Dorsalgia, unspecified: Secondary | ICD-10-CM | POA: Insufficient documentation

## 2018-11-08 DIAGNOSIS — M4804 Spinal stenosis, thoracic region: Secondary | ICD-10-CM | POA: Insufficient documentation

## 2018-11-08 DIAGNOSIS — M5135 Other intervertebral disc degeneration, thoracolumbar region: Secondary | ICD-10-CM | POA: Diagnosis not present

## 2018-11-08 DIAGNOSIS — R269 Unspecified abnormalities of gait and mobility: Secondary | ICD-10-CM

## 2018-11-15 ENCOUNTER — Telehealth: Payer: Self-pay | Admitting: *Deleted

## 2018-11-15 ENCOUNTER — Telehealth: Payer: Self-pay | Admitting: Internal Medicine

## 2018-11-15 NOTE — Telephone Encounter (Signed)
Copied from [place] [id number]. Topic: Quick Communication - See Telephone Encounter >> Nov 15, 2018 11:54 AM Vernona Rieger wrote: CRM for notification. See Telephone encounter for: 11/15/18.  Patient would like to receive his MRI results from 11/08/18. Please contact patient.

## 2018-11-15 NOTE — Telephone Encounter (Signed)
Copied from [place] [phone]. Topic: Quick Communication - See Telephone Encounter >> Nov 15, 2018 11:54 AM Vernona Rieger wrote: CRM for notification. See Telephone encounter for: 11/15/18.  Patient would like to receive his MRI results from 11/08/18. Please contact patient. >> Nov 15, 2018 11:57 AM Ahmed Prima L wrote: Also, patient would like to know when his colonoscopy is. Please Advise

## 2018-12-12 ENCOUNTER — Ambulatory Visit (INDEPENDENT_AMBULATORY_CARE_PROVIDER_SITE_OTHER): Payer: Medicare HMO | Admitting: Internal Medicine

## 2018-12-12 ENCOUNTER — Encounter: Payer: Self-pay | Admitting: Internal Medicine

## 2018-12-12 VITALS — BP 112/70 | HR 65 | Temp 98.2°F | Ht 68.0 in | Wt 167.4 lb

## 2018-12-12 DIAGNOSIS — R972 Elevated prostate specific antigen [PSA]: Secondary | ICD-10-CM

## 2018-12-12 DIAGNOSIS — R937 Abnormal findings on diagnostic imaging of other parts of musculoskeletal system: Secondary | ICD-10-CM | POA: Insufficient documentation

## 2018-12-12 DIAGNOSIS — E7800 Pure hypercholesterolemia, unspecified: Secondary | ICD-10-CM | POA: Insufficient documentation

## 2018-12-12 DIAGNOSIS — N400 Enlarged prostate without lower urinary tract symptoms: Secondary | ICD-10-CM

## 2018-12-12 DIAGNOSIS — M549 Dorsalgia, unspecified: Secondary | ICD-10-CM

## 2018-12-12 DIAGNOSIS — Z720 Tobacco use: Secondary | ICD-10-CM

## 2018-12-12 DIAGNOSIS — E559 Vitamin D deficiency, unspecified: Secondary | ICD-10-CM

## 2018-12-12 DIAGNOSIS — R109 Unspecified abdominal pain: Secondary | ICD-10-CM

## 2018-12-12 DIAGNOSIS — E785 Hyperlipidemia, unspecified: Secondary | ICD-10-CM | POA: Insufficient documentation

## 2018-12-12 MED ORDER — CHOLECALCIFEROL 1.25 MG (50000 UT) PO CAPS: 50000.0000 [IU] | ORAL_CAPSULE | ORAL | 1 refills | Status: DC

## 2018-12-12 NOTE — Patient Instructions (Addendum)
Try Lidocaine/Salonpas pain patches over the counter   Referred to [facility] in [place] for enlarged prostate  Consider Tdap vaccine    Cholesterol Cholesterol is a white, waxy, fat-like substance that is needed by the human body in small amounts. The liver makes all the cholesterol we need. Cholesterol is carried from the liver by the blood through the blood vessels. Deposits of cholesterol (plaques) may build up on blood vessel (artery) walls. Plaques make the arteries narrower and stiffer. Cholesterol plaques increase the risk for heart attack and stroke. You cannot feel your cholesterol level even if it is very high. The only way to know that it is high is to have a blood test. Once you know your cholesterol levels, you should keep a record of the test results. Work with your health care provider to keep your levels in the desired range. What do the results mean?  Total cholesterol is a rough measure of all the cholesterol in your blood.  LDL (low-density lipoprotein) is the "bad" cholesterol. This is the type that causes plaque to build up on the artery walls. You want this level to be low.  HDL (high-density lipoprotein) is the "good" cholesterol because it cleans the arteries and carries the LDL away. You want this level to be high.  Triglycerides are fat that the body can either burn for energy or store. High levels are closely linked to heart disease. What are the desired levels of cholesterol?  Total cholesterol below 200.  LDL below 100 for people who are at risk, below 70 for people at very high risk.  HDL above 40 is good. A level of 60 or higher is considered to be protective against heart disease.  Triglycerides below 150. How can I lower my cholesterol? Diet Follow your diet program as told by your health care provider.  Choose fish or white meat chicken and [place], roasted or baked. Limit fatty cuts of red meat, fried foods, and processed meats, such as  sausage and lunch meats.  Eat lots of fresh fruits and vegetables.  Choose whole grains, beans, pasta, potatoes, and cereals.  Choose olive oil, corn oil, or canola oil, and use only small amounts.  Avoid butter, mayonnaise, shortening, or palm kernel oils.  Avoid foods with trans fats.  Drink skim or nonfat milk and eat low-fat or nonfat yogurt and cheeses. Avoid whole milk, cream, ice cream, egg yolks, and full-fat cheeses.  Healthier desserts include angel food cake, ginger snaps, animal crackers, hard candy, popsicles, and low-fat or nonfat frozen yogurt. Avoid pastries, cakes, pies, and cookies.  Exercise  Follow your exercise program as told by your health care provider. A regular program: ? Helps to decrease LDL and raise HDL. ? Helps with weight control.  Do things that increase your activity level, such as gardening, walking, and taking the stairs.  Ask your health care provider about ways that you can be more active in your daily life. Medicine  Take over-the-counter and prescription medicines only as told by your health care provider. ? Medicine may be prescribed by your health care provider to help lower cholesterol and decrease the risk for heart disease. This is usually done if diet and exercise have failed to bring down cholesterol levels. ? If you have several risk factors, you may need medicine even if your levels are normal. This information is not intended to replace advice given to you by your health care provider. Make sure you discuss any questions you have with  your health care provider. Document Released: 07/25/2001 Document Revised: 05/27/2016 Document Reviewed: 04/29/2016 Elsevier Interactive Patient Education  2019 Elsevier Inc.  Benign Prostatic Hyperplasia  Benign prostatic hyperplasia (BPH) is an enlarged prostate gland that is caused by the normal aging process and not by cancer. The prostate is a walnut-sized gland that is involved in the  production of semen. It is located in front of the rectum and below the bladder. The bladder stores urine and the urethra is the tube that carries the urine out of the body. The prostate may get bigger as a man gets older. An enlarged prostate can press on the urethra. This can make it harder to pass urine. The build-up of urine in the bladder can cause infection. Back pressure and infection may progress to bladder damage and kidney (renal) failure. What are the causes? This condition is part of a normal aging process. However, not all men develop problems from this condition. If the prostate enlarges away from the urethra, urine flow will not be blocked. If it enlarges toward the urethra and compresses it, there will be problems passing urine. What increases the risk? This condition is more likely to develop in men over the age of 70 years. What are the signs or symptoms? Symptoms of this condition include:  Getting up often during the night to urinate.  Needing to urinate frequently during the day.  Difficulty starting urine flow.  Decrease in size and strength of your urine stream.  Leaking (dribbling) after urinating.  Inability to pass urine. This needs immediate treatment.  Inability to completely empty your bladder.  Pain when you pass urine. This is more common if there is also an infection.  Urinary tract infection (UTI). How is this diagnosed? This condition is diagnosed based on your medical history, a physical exam, and your symptoms. Tests will also be done, such as:  A post-void bladder scan. This measures any amount of urine that may remain in your bladder after you finish urinating.  A digital rectal exam. In a rectal exam, your health care provider checks your prostate by putting a lubricated, gloved finger into your rectum to feel the back of your prostate gland. This exam detects the size of your gland and any abnormal lumps or growths.  An exam of your urine  (urinalysis).  A prostate specific antigen (PSA) screening. This is a blood test used to screen for prostate cancer.  An ultrasound. This test uses sound waves to electronically produce a picture of your prostate gland. Your health care provider may refer you to a specialist in kidney and prostate diseases (urologist). How is this treated? Once symptoms begin, your health care provider will monitor your condition (active surveillance or watchful waiting). Treatment for this condition will depend on the severity of your condition. Treatment may include:  Observation and yearly exams. This may be the only treatment needed if your condition and symptoms are mild.  Medicines to relieve your symptoms, including: ? Medicines to shrink the prostate. ? Medicines to relax the muscle of the prostate.  Surgery in severe cases. Surgery may include: ? Prostatectomy. In this procedure, the prostate tissue is removed completely through an open incision or with a laparascope or robotics. ? Transurethral resection of the prostate (TURP). In this procedure, a tool is inserted through the opening at the tip of the penis (urethra). It is used to cut away tissue of the inner core of the prostate. The pieces are removed through the same opening  of the penis. This removes the blockage. ? Transurethral incision (TUIP). In this procedure, small cuts are made in the prostate. This lessens the prostate's pressure on the urethra. ? Transurethral microwave thermotherapy (TUMT). This procedure uses microwaves to create heat. The heat destroys and removes a small amount of prostate tissue. ? Transurethral needle ablation (TUNA). This procedure uses radio frequencies to destroy and remove a small amount of prostate tissue. ? Interstitial laser coagulation ([place]). This procedure uses a laser to destroy and remove a small amount of prostate tissue. ? Transurethral electrovaporization (TUVP). This procedure uses electrodes to  destroy and remove a small amount of prostate tissue. ? Prostatic urethral lift. This procedure inserts an implant to push the lobes of the prostate away from the urethra. Follow these instructions at home:  Take over-the-counter and prescription medicines only as told by your health care provider.  Monitor your symptoms for any changes. Contact your health care provider with any changes.  Avoid drinking large amounts of liquid before going to bed or out in public.  Avoid or reduce how much caffeine or alcohol you drink.  Give yourself time when you urinate.  Keep all follow-up visits as told by your health care provider. This is important. Contact a health care provider if:  You have unexplained back pain.  Your symptoms do not get better with treatment.  You develop side effects from the medicine you are taking.  Your urine becomes very dark or has a bad smell.  Your lower abdomen becomes distended and you have trouble passing your urine. Get help right away if:  You have a fever or chills.  You suddenly cannot urinate.  You feel lightheaded, or very dizzy, or you faint.  There are large amounts of blood or clots in the urine.  Your urinary problems become hard to manage.  You develop moderate to severe low back or flank pain. The flank is the side of your body between the ribs and the hip. These symptoms may represent a serious problem that is an emergency. Do not wait to see if the symptoms will go away. Get medical help right away. Call your local emergency services (911 in the [place] Do not drive yourself to the hospital. Summary  Benign prostatic hyperplasia (BPH) is an enlarged prostate that is caused by the normal aging process and not by cancer.  An enlarged prostate can press on the urethra. This can make it hard to pass urine.  This condition is part of a normal aging process and is more likely to develop in men over the age of 15 years.  Get help right  away if you suddenly cannot urinate. This information is not intended to replace advice given to you by your health care provider. Make sure you discuss any questions you have with your health care provider. Document Released: 10/30/2005 Document Revised: 12/04/2016 Document Reviewed: 12/04/2016 Elsevier Interactive Patient Education  2019 [employer].
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
## 2018-12-27 ENCOUNTER — Telehealth: Payer: Self-pay

## 2018-12-27 ENCOUNTER — Other Ambulatory Visit: Payer: Self-pay | Admitting: Internal Medicine

## 2018-12-27 DIAGNOSIS — M549 Dorsalgia, unspecified: Secondary | ICD-10-CM

## 2018-12-27 DIAGNOSIS — M47819 Spondylosis without myelopathy or radiculopathy, site unspecified: Secondary | ICD-10-CM

## 2018-12-27 DIAGNOSIS — M545 Low back pain, unspecified: Secondary | ICD-10-CM

## 2018-12-27 DIAGNOSIS — R109 Unspecified abdominal pain: Secondary | ICD-10-CM

## 2018-12-27 DIAGNOSIS — G8929 Other chronic pain: Secondary | ICD-10-CM
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
## 2018-12-27 NOTE — Telephone Encounter (Signed)
Referral to [facility]ractor if does not work rec spine specialist for steroid injection in back  Let me know   [place]

## 2018-12-30 NOTE — Telephone Encounter (Signed)
Pt calling about the referral and haven't heard from anyone and would like a call back pt states that he can do referral on a Friday please call pt

## 2018-12-30 NOTE — Telephone Encounter (Signed)
Patient is aware and will call with update about seeing chiropractor and if he needs to see a spine specialist

## 2019-01-10 ENCOUNTER — Telehealth: Payer: Self-pay | Admitting: *Deleted

## 2019-01-10 NOTE — Telephone Encounter (Signed)
Results have been faxed electronically to Dr Francisca December

## 2019-01-10 NOTE — Telephone Encounter (Signed)
Copied from [place] [phone]. Topic: Referral - Medical Records >> Jan 10, 2019 11:35 AM Rutherford Nail, NT wrote: Reason for CRM: Patient calling and states that Dr Francisca December, the chiropractor that he was referred to, is needing a copy of his MRI from 11/08/2018. Please advise.  Fax#: [phone] Telephone# to Dr Francisca December: [phone]

## 2019-01-16 ENCOUNTER — Telehealth: Payer: Self-pay

## 2019-01-16 ENCOUNTER — Telehealth: Payer: Self-pay | Admitting: Internal Medicine
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
## 2019-03-21 ENCOUNTER — Telehealth (INDEPENDENT_AMBULATORY_CARE_PROVIDER_SITE_OTHER): Payer: Medicare Other | Admitting: Internal Medicine

## 2019-03-21 ENCOUNTER — Other Ambulatory Visit: Payer: Self-pay | Admitting: Internal Medicine

## 2019-03-21 DIAGNOSIS — K047 Periapical abscess without sinus: Secondary | ICD-10-CM | POA: Diagnosis not present

## 2019-03-21 DIAGNOSIS — K089 Disorder of teeth and supporting structures, unspecified: Secondary | ICD-10-CM

## 2019-03-21 MED ORDER — AMOXICILLIN-POT CLAVULANATE 875-125 MG PO TABS: 1.0000 | ORAL_TABLET | Freq: Two times a day (BID) | ORAL | 0 refills | Status: DC

## 2019-03-21 NOTE — Telephone Encounter (Signed)
On Doxy/telephone with significant other Marylene Land  Pt c/o top tooth dental pain and abscess appt with dentist in [facility] 03/26/2019 and wants Abx for dental pain   Rx for dental pain billable  Time spent <5 minutes  Karlyn Agee Scocuzza

## 2019-04-03 ENCOUNTER — Telehealth: Payer: Self-pay | Admitting: Internal Medicine

## 2019-04-03 NOTE — Telephone Encounter (Signed)
Copied from [place] [phone]. Topic: Quick Communication - See Telephone Encounter >> Apr 03, 2019  3:13 PM Vernona Rieger wrote: CRM for notification. See Telephone encounter for: 04/03/19. Patient still has not been able to see the dentist and is asking for a refill on amoxicillin-clavulanate (AUGMENTIN) 875-125 MG tablet  [place] [address]), [place] [phone] (Phone) [phone] (Fax)

## 2019-04-04 ENCOUNTER — Other Ambulatory Visit: Payer: Self-pay | Admitting: Internal Medicine

## 2019-04-04 DIAGNOSIS — K047 Periapical abscess without sinus: Secondary | ICD-10-CM

## 2019-04-04 DIAGNOSIS — K089 Disorder of teeth and supporting structures, unspecified: Secondary | ICD-10-CM

## 2019-04-04 MED ORDER — AMOXICILLIN-POT CLAVULANATE 875-125 MG PO TABS: 1.0000 | ORAL_TABLET | Freq: Two times a day (BID) | ORAL | 0 refills | Status: DC

## 2019-04-04 NOTE — Telephone Encounter (Signed)
Pt needs to schedule a dentist appt asap Sent antibiotic again but see above  [place]

## 2019-04-08 NOTE — Telephone Encounter (Signed)
Patient notified and voiced understanding. Patient has an appointment on 04/14/19

## 2019-05-21 ENCOUNTER — Telehealth: Payer: Self-pay | Admitting: *Deleted

## 2019-05-21 NOTE — Telephone Encounter (Signed)
Left message for patient to notify them that it is time to schedule annual low dose lung cancer screening CT scan. Instructed patient to call back to verify information prior to the scan being scheduled.  

## 2019-06-04 DIAGNOSIS — L259 Unspecified contact dermatitis, unspecified cause: Secondary | ICD-10-CM | POA: Diagnosis not present

## 2019-06-04 DIAGNOSIS — B379 Candidiasis, unspecified: Secondary | ICD-10-CM | POA: Diagnosis not present

## 2019-06-13 ENCOUNTER — Ambulatory Visit: Payer: Medicare HMO | Admitting: Internal Medicine

## 2019-06-13 ENCOUNTER — Other Ambulatory Visit: Payer: Self-pay

## 2019-06-13 ENCOUNTER — Ambulatory Visit (INDEPENDENT_AMBULATORY_CARE_PROVIDER_SITE_OTHER): Payer: Medicare Other | Admitting: Internal Medicine

## 2019-06-13 ENCOUNTER — Telehealth: Payer: Self-pay | Admitting: *Deleted

## 2019-06-13 DIAGNOSIS — Z1329 Encounter for screening for other suspected endocrine disorder: Secondary | ICD-10-CM

## 2019-06-13 DIAGNOSIS — R937 Abnormal findings on diagnostic imaging of other parts of musculoskeletal system: Secondary | ICD-10-CM | POA: Diagnosis not present

## 2019-06-13 DIAGNOSIS — N400 Enlarged prostate without lower urinary tract symptoms: Secondary | ICD-10-CM

## 2019-06-13 DIAGNOSIS — E559 Vitamin D deficiency, unspecified: Secondary | ICD-10-CM | POA: Diagnosis not present

## 2019-06-13 DIAGNOSIS — M1990 Unspecified osteoarthritis, unspecified site: Secondary | ICD-10-CM | POA: Diagnosis not present

## 2019-06-13 DIAGNOSIS — E785 Hyperlipidemia, unspecified: Secondary | ICD-10-CM | POA: Diagnosis not present

## 2019-06-13 DIAGNOSIS — Z1389 Encounter for screening for other disorder: Secondary | ICD-10-CM

## 2019-06-13 DIAGNOSIS — Z125 Encounter for screening for malignant neoplasm of prostate: Secondary | ICD-10-CM

## 2019-06-13 DIAGNOSIS — Z0000 Encounter for general adult medical examination without abnormal findings: Secondary | ICD-10-CM

## 2019-06-13 NOTE — Telephone Encounter (Signed)
Copied from [place] [id number]. Topic: General - Inquiry >> Jun 13, 2019  3:47 PM Richardo Priest, NT wrote: Reason for CRM: Patient called in with address to send paperwork to attorney. Address is [address] [place] [address]. Please advise and call back is [phone]. Patient would like a copy made as well so he can pick up the copy about the first of the week.
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
## 2019-06-13 NOTE — Telephone Encounter (Signed)
Juan [name] see your box   Thanks tMS

## 2019-06-20 NOTE — Telephone Encounter (Signed)
Copies should be at the front for him this was addressed 1 week ago   We can fax all notes and imaging scans to his attorney please or mail to attorney   Thanks [place]

## 2019-06-20 NOTE — Telephone Encounter (Signed)
Pt called and requesting an update. Pt is also requesting a copy of paperwork be sent to his P.O. box as well. Pt is requesting a call when this is done. Please advise.

## 2019-06-20 NOTE — Telephone Encounter (Signed)
It has been already been faxed to his attorneys office.

## 2019-06-27 ENCOUNTER — Telehealth: Payer: Self-pay | Admitting: Internal Medicine

## 2019-06-27 NOTE — Telephone Encounter (Signed)
Pt stated that since he is returning to work, he would like to know if Dr. Aundra Dubin would send in something for pain. He doesn't know if he needs to speak with Dr. Olivia Mackie again and she can call him. She asked if would like steroid shots and he declined at the time but he may reconsider if nothing can be sent in for pain. Please advise. CB#[phone]

## 2019-06-30 ENCOUNTER — Other Ambulatory Visit: Payer: Self-pay | Admitting: Internal Medicine

## 2019-06-30 DIAGNOSIS — M545 Low back pain, unspecified: Secondary | ICD-10-CM

## 2019-06-30 MED ORDER — MELOXICAM 7.5 MG PO TABS: 7.5000 mg | ORAL_TABLET | Freq: Two times a day (BID) | ORAL | 2 refills | Status: DC | PRN

## 2019-06-30 NOTE — Telephone Encounter (Signed)
Pt would like a copy of MRI to be mailed to his [address] address for his records.

## 2019-06-30 NOTE — Telephone Encounter (Signed)
Sent meloxicam 7.5 mg he can take up to 2x per day with Tylenol/acetaminophen over the counter 500 mg up to 6 x per day  If this doesn't work would rec seeing back specialist for steroid injections Sent to [facility] in [place]   [employer] [facility]

## 2019-07-01 NOTE — Telephone Encounter (Signed)
Patient aware.  Patient voiced understanding with no questions.

## 2019-08-01 ENCOUNTER — Ambulatory Visit: Payer: Medicare Other

## 2019-08-01 ENCOUNTER — Telehealth: Payer: Self-pay

## 2019-08-01 NOTE — Telephone Encounter (Signed)
Called patient at agreed upon time for medicare annual wellness and health maintenance update for his doctor. No answer. Left a message to call me back within the scheduled window or reschedule the appointment as appropriate.

## 2019-08-08 ENCOUNTER — Other Ambulatory Visit: Payer: Self-pay

## 2019-08-08 ENCOUNTER — Ambulatory Visit (INDEPENDENT_AMBULATORY_CARE_PROVIDER_SITE_OTHER): Payer: Medicare Other

## 2019-08-08 DIAGNOSIS — Z0000 Encounter for general adult medical examination without abnormal findings: Secondary | ICD-10-CM

## 2019-08-08 NOTE — Patient Instructions (Addendum)
  Mr. Juan [name] , Thank you for taking time to come for your Medicare Wellness Visit. I appreciate your ongoing commitment to your health goals. Please review the following plan we discussed and let me know if I can assist you in the future.   These are the goals we discussed: Goals      Patient Stated   . DIET - INCREASE WATER INTAKE (pt-stated)    . Increase physical activity (pt-stated)     I would like to play golf. I will increase activity as tolerated.        This is a list of the screening recommended for you and due dates:  Health Maintenance  Topic Date Due  . Tetanus Vaccine  12/13/2019*  . Pneumonia vaccines (1 of 2 - PCV13) 12/13/2019*  . Flu Shot  02/11/2020*  . Colon Cancer Screening  06/17/2022  .  Hepatitis C: One time screening is recommended by Center for Disease Control  (CDC) for  adults born from [birth year] through 1966.   Completed  *Topic was postponed. The date shown is not the original due date.
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
## 2019-08-14 ENCOUNTER — Encounter: Payer: Self-pay | Admitting: *Deleted

## 2019-08-14 ENCOUNTER — Telehealth: Payer: Self-pay | Admitting: *Deleted

## 2019-08-14 NOTE — Telephone Encounter (Signed)
Contacted regarding lung screening scan being due. Patient reports he is working and has difficulty finding time for scan. Requests scan in [place] . Patient notified that I can only arrange for scan to be done at [facility] facilities. Patient says he will have to call me back.

## 2019-09-04 ENCOUNTER — Telehealth: Payer: Self-pay | Admitting: *Deleted

## 2019-09-04 DIAGNOSIS — Z87891 Personal history of nicotine dependence: Secondary | ICD-10-CM

## 2019-09-04 DIAGNOSIS — Z122 Encounter for screening for malignant neoplasm of respiratory organs: Secondary | ICD-10-CM

## 2019-09-04 NOTE — Telephone Encounter (Signed)
Patient has been notified that annual lung cancer screening low dose CT scan is due currently or will be in near future. Confirmed that patient is within the age range of 55-77, and asymptomatic, (no signs or symptoms of lung cancer). Patient denies illness that would prevent curative treatment for lung cancer if found. Verified smoking history, (current, 40.25 pack year). The shared decision making visit was done 01/28/18. Patient is agreeable for CT scan being scheduled.

## 2019-09-08 ENCOUNTER — Ambulatory Visit
Admission: RE | Admit: 2019-09-08 | Discharge: 2019-09-08 | Disposition: A | Discharge status: Home / Self Care | Payer: Medicare Other | Source: Ambulatory Visit | Attending: Nurse Practitioner | Admitting: Nurse Practitioner

## 2019-09-08 ENCOUNTER — Other Ambulatory Visit: Payer: Self-pay

## 2019-09-08 DIAGNOSIS — Z122 Encounter for screening for malignant neoplasm of respiratory organs: Secondary | ICD-10-CM | POA: Diagnosis not present

## 2019-09-08 DIAGNOSIS — Z87891 Personal history of nicotine dependence: Secondary | ICD-10-CM | POA: Diagnosis not present

## 2019-09-11 ENCOUNTER — Telehealth: Payer: Self-pay | Admitting: Internal Medicine

## 2019-09-11 ENCOUNTER — Encounter: Payer: Self-pay | Admitting: *Deleted

## 2019-09-11 NOTE — Telephone Encounter (Signed)
CT chest + COPD  rec stop smoking   TMS

## 2019-09-12 ENCOUNTER — Other Ambulatory Visit: Payer: Self-pay | Admitting: Internal Medicine

## 2019-09-12 NOTE — Telephone Encounter (Signed)
Would he like referral in [place], [place] or [place]?   [place]

## 2019-09-12 NOTE — Telephone Encounter (Signed)
Patient was informed of results.  Patient understood and no questions, comments, or concerns at this time. Patient want referral GI due to stomach gurgling, growling, no gas, no diarrhea, no diet changes, No pain, a lot of movements.

## 2019-09-16 ENCOUNTER — Other Ambulatory Visit: Payer: Self-pay | Admitting: Internal Medicine

## 2019-09-16 DIAGNOSIS — Z1211 Encounter for screening for malignant neoplasm of colon: Secondary | ICD-10-CM

## 2019-09-16 DIAGNOSIS — R1912 Hyperactive bowel sounds: Secondary | ICD-10-CM

## 2019-09-16 NOTE — Telephone Encounter (Signed)
Patient would like referral to be in [place]

## 2019-09-16 NOTE — Telephone Encounter (Signed)
Referral sent [facility] in [place]   thanks  [place]

## 2019-11-10 ENCOUNTER — Telehealth: Payer: Self-pay

## 2019-11-10 ENCOUNTER — Other Ambulatory Visit (INDEPENDENT_AMBULATORY_CARE_PROVIDER_SITE_OTHER): Payer: Medicare Other

## 2019-11-10 ENCOUNTER — Other Ambulatory Visit: Payer: Self-pay

## 2019-11-10 DIAGNOSIS — N400 Enlarged prostate without lower urinary tract symptoms: Secondary | ICD-10-CM

## 2019-11-10 DIAGNOSIS — E785 Hyperlipidemia, unspecified: Secondary | ICD-10-CM | POA: Diagnosis not present

## 2019-11-10 DIAGNOSIS — Z125 Encounter for screening for malignant neoplasm of prostate: Secondary | ICD-10-CM | POA: Diagnosis not present

## 2019-11-10 DIAGNOSIS — E559 Vitamin D deficiency, unspecified: Secondary | ICD-10-CM

## 2019-11-10 DIAGNOSIS — Z0000 Encounter for general adult medical examination without abnormal findings: Secondary | ICD-10-CM | POA: Diagnosis not present

## 2019-11-10 DIAGNOSIS — Z1329 Encounter for screening for other suspected endocrine disorder: Secondary | ICD-10-CM

## 2019-11-10 DIAGNOSIS — Z1389 Encounter for screening for other disorder: Secondary | ICD-10-CM

## 2019-11-10 LAB — CBC WITH DIFFERENTIAL/PLATELET
Basophils Absolute: 0 10*3/uL (ref 0.0–0.1)
Basophils Relative: 0.5 % (ref 0.0–3.0)
Eosinophils Absolute: 0 10*3/uL (ref 0.0–0.7)
Eosinophils Relative: 0.6 % (ref 0.0–5.0)
HCT: 40.1 % (ref 39.0–52.0)
Hemoglobin: 13.5 g/dL (ref 13.0–17.0)
Lymphocytes Relative: 36.2 % (ref 12.0–46.0)
Lymphs Abs: 2.3 10*3/uL (ref 0.7–4.0)
MCHC: 33.7 g/dL (ref 30.0–36.0)
MCV: 99.8 fl (ref 78.0–100.0)
Monocytes Absolute: 0.5 10*3/uL (ref 0.1–1.0)
Monocytes Relative: 8.1 % (ref 3.0–12.0)
Neutro Abs: 3.5 10*3/uL (ref 1.4–7.7)
Neutrophils Relative %: 54.6 % (ref 43.0–77.0)
Platelets: 139 10*3/uL — ABNORMAL LOW (ref 150.0–400.0)
RBC: 4.01 Mil/uL — ABNORMAL LOW (ref 4.22–5.81)
RDW: 14.6 % (ref 11.5–15.5)
WBC: 6.4 10*3/uL (ref 4.0–10.5)

## 2019-11-10 NOTE — Addendum Note (Signed)
Addended by: Zannie Cove on: 11/10/2019 04:08 PM   Modules accepted: Orders

## 2019-11-10 NOTE — Telephone Encounter (Signed)
Called and let Juan [name] know that I forgot to get a urine specimen from him while he was here for his bloodwork.  Informed him to RTC to provide sample when convenient- no appt. Necessary, order already in. Thanks

## 2019-11-10 NOTE — Telephone Encounter (Signed)
Also no lab charge please   Thanks [place]

## 2019-11-11 LAB — COMPREHENSIVE METABOLIC PANEL
ALT: 8 U/L (ref 0–53)
AST: 17 U/L (ref 0–37)
Albumin: 3.9 g/dL (ref 3.5–5.2)
Alkaline Phosphatase: 66 U/L (ref 39–117)
BUN: 11 mg/dL (ref 6–23)
CO2: 28 mEq/L (ref 19–32)
Calcium: 9.2 mg/dL (ref 8.4–10.5)
Chloride: 104 mEq/L (ref 96–112)
Creatinine, Ser: 0.74 mg/dL (ref 0.40–1.50)
GFR: 126.54 mL/min (ref >60.00)
Glucose, Bld: 91 mg/dL (ref 70–99)
Potassium: 4.6 mEq/L (ref 3.5–5.1)
Sodium: 139 mEq/L (ref 135–145)
Total Bilirubin: 0.6 mg/dL (ref 0.2–1.2)
Total Protein: 6.4 g/dL (ref 6.0–8.3)

## 2019-11-11 LAB — VITAMIN D 25 HYDROXY (VIT D DEFICIENCY, FRACTURES): VITD: 48.76 ng/mL (ref 30.00–100.00)

## 2019-11-11 LAB — LIPID PANEL
Cholesterol: 231 mg/dL — ABNORMAL HIGH (ref 0–200)
HDL: 67.6 mg/dL (ref >39.00)
LDL Cholesterol: 148 mg/dL — ABNORMAL HIGH (ref 0–99)
NonHDL: 163.49
Total CHOL/HDL Ratio: 3
Triglycerides: 79 mg/dL (ref 0.0–149.0)
VLDL: 15.8 mg/dL (ref 0.0–40.0)

## 2019-11-11 LAB — TSH: TSH: 2.33 u[IU]/mL (ref 0.35–4.50)

## 2019-11-11 LAB — PSA, MEDICARE: PSA: 3.18 ng/ml (ref 0.10–4.00)

## 2019-11-17 ENCOUNTER — Telehealth: Payer: Self-pay | Admitting: Internal Medicine

## 2019-11-17 NOTE — Telephone Encounter (Signed)
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 Thank you!

## 2019-11-18 ENCOUNTER — Ambulatory Visit (INDEPENDENT_AMBULATORY_CARE_PROVIDER_SITE_OTHER): Payer: Medicare Other | Admitting: Internal Medicine

## 2019-11-18 ENCOUNTER — Other Ambulatory Visit: Payer: Self-pay

## 2019-11-18 ENCOUNTER — Encounter: Payer: Self-pay | Admitting: Internal Medicine

## 2019-11-18 VITALS — Ht 68.5 in | Wt 158.0 lb

## 2019-11-18 DIAGNOSIS — R937 Abnormal findings on diagnostic imaging of other parts of musculoskeletal system: Secondary | ICD-10-CM

## 2019-11-18 DIAGNOSIS — M549 Dorsalgia, unspecified: Secondary | ICD-10-CM

## 2019-11-18 DIAGNOSIS — Z1211 Encounter for screening for malignant neoplasm of colon: Secondary | ICD-10-CM

## 2019-11-18 DIAGNOSIS — R1013 Epigastric pain: Secondary | ICD-10-CM

## 2019-11-18 DIAGNOSIS — E785 Hyperlipidemia, unspecified: Secondary | ICD-10-CM

## 2019-11-18 DIAGNOSIS — N400 Enlarged prostate without lower urinary tract symptoms: Secondary | ICD-10-CM

## 2019-11-18 DIAGNOSIS — E559 Vitamin D deficiency, unspecified: Secondary | ICD-10-CM

## 2019-11-18 DIAGNOSIS — Z720 Tobacco use: Secondary | ICD-10-CM

## 2019-11-18 DIAGNOSIS — M1990 Unspecified osteoarthritis, unspecified site: Secondary | ICD-10-CM

## 2019-11-18 DIAGNOSIS — J449 Chronic obstructive pulmonary disease, unspecified: Secondary | ICD-10-CM

## 2019-11-18 NOTE — Patient Instructions (Signed)
Back Exercises The following exercises strengthen the muscles that help to support the trunk and back. They also help to keep the lower back flexible. Doing these exercises can help to prevent back pain or lessen existing pain.  If you have back pain or discomfort, try doing these exercises 2-3 times each day or as told by your health care provider.  As your pain improves, do them once each day, but increase the number of times that you repeat the steps for each exercise (do more repetitions).  To prevent the recurrence of back pain, continue to do these exercises once each day or as told by your health care provider. Do exercises exactly as told by your health care provider and adjust them as directed. It is normal to feel mild stretching, pulling, tightness, or discomfort as you do these exercises, but you should stop right away if you feel sudden pain or your pain gets worse. Exercises Single knee to chest Repeat these steps 3-5 times for each leg: 1. Lie on your back on a firm bed or the floor with your legs extended. 2. Bring one knee to your chest. Your other leg should stay extended and in contact with the floor. 3. Hold your knee in place by grabbing your knee or thigh with both hands and hold. 4. Pull on your knee until you feel a gentle stretch in your lower back or buttocks. 5. Hold the stretch for 10-30 seconds. 6. Slowly release and straighten your leg. Pelvic tilt Repeat these steps 5-10 times: 1. Lie on your back on a firm bed or the floor with your legs extended. 2. Bend your knees so they are pointing toward the ceiling and your feet are flat on the floor. 3. Tighten your lower abdominal muscles to press your lower back against the floor. This motion will tilt your pelvis so your tailbone points up toward the ceiling instead of pointing to your feet or the floor. 4. With gentle tension and even breathing, hold this position for 5-10 seconds. Cat-cow Repeat these steps until  your lower back becomes more flexible: 1. Get into a hands-and-knees position on a firm surface. Keep your hands under your shoulders, and keep your knees under your hips. You may place padding under your knees for comfort. 2. Let your head hang down toward your chest. Contract your abdominal muscles and point your tailbone toward the floor so your lower back becomes rounded like the back of a cat. 3. Hold this position for 5 seconds. 4. Slowly lift your head, let your abdominal muscles relax and point your tailbone up toward the ceiling so your back forms a sagging arch like the back of a cow. 5. Hold this position for 5 seconds.  Press-ups Repeat these steps 5-10 times: 1. Lie on your abdomen (face-down) on the floor. 2. Place your palms near your head, about shoulder-width apart. 3. Keeping your back as relaxed as possible and keeping your hips on the floor, slowly straighten your arms to raise the top half of your body and lift your shoulders. Do not use your back muscles to raise your upper torso. You may adjust the placement of your hands to make yourself more comfortable. 4. Hold this position for 5 seconds while you keep your back relaxed. 5. Slowly return to lying flat on the floor.  Bridges Repeat these steps 10 times: 1. Lie on your back on a firm surface. 2. Bend your knees so they are pointing toward the ceiling and   your feet are flat on the floor. Your arms should be flat at your sides, next to your body. 3. Tighten your buttocks muscles and lift your buttocks off the floor until your waist is at almost the same height as your knees. You should feel the muscles working in your buttocks and the back of your thighs. If you do not feel these muscles, slide your feet 1-2 inches farther away from your buttocks. 4. Hold this position for 3-5 seconds. 5. Slowly lower your hips to the starting position, and allow your buttocks muscles to relax completely. If this exercise is too easy, try  doing it with your arms crossed over your chest. Abdominal crunches Repeat these steps 5-10 times: 1. Lie on your back on a firm bed or the floor with your legs extended. 2. Bend your knees so they are pointing toward the ceiling and your feet are flat on the floor. 3. Cross your arms over your chest. 4. Tip your chin slightly toward your chest without bending your neck. 5. Tighten your abdominal muscles and slowly raise your trunk (torso) high enough to lift your shoulder blades a tiny bit off the floor. Avoid raising your torso higher than that because it can put too much stress on your low back and does not help to strengthen your abdominal muscles. 6. Slowly return to your starting position. Back lifts Repeat these steps 5-10 times: 1. Lie on your abdomen (face-down) with your arms at your sides, and rest your forehead on the floor. 2. Tighten the muscles in your legs and your buttocks. 3. Slowly lift your chest off the floor while you keep your hips pressed to the floor. Keep the back of your head in line with the curve in your back. Your eyes should be looking at the floor. 4. Hold this position for 3-5 seconds. 5. Slowly return to your starting position. Contact a health care provider if:  Your back pain or discomfort gets much worse when you do an exercise.  Your worsening back pain or discomfort does not lessen within 2 hours after you exercise. If you have any of these problems, stop doing these exercises right away. Do not do them again unless your health care provider says that you can. Get help right away if:  You develop sudden, severe back pain. If this happens, stop doing the exercises right away. Do not do them again unless your health care provider says that you can. This information is not intended to replace advice given to you by your health care provider. Make sure you discuss any questions you have with your health care provider. Document Revised: 03/06/2019 Document  Reviewed: 08/01/2018 Elsevier Patient Education  [place].  Herniated Disk  A herniated disk happens when a disk in the spine bulges out too far. There is an oval disk between each pair of bones (vertebrae) in the backbone, or spine. The disks connect the bones, help the spine move, and keep the bones from rubbing against each other when you move. A herniated disk can happen anywhere in the back or neck area. It most often affects the lower back. What are the causes? This condition may be caused by:  Wear and tear as you age.  Sudden injury, such as a strain or sprain. What increases the risk? The following factors may make you more likely to develop this condition:  Aging. This is the main thing that raises your risk.  Being a man who is 30-50 years  old.  Frequently doing activities that involve heavy lifting, bending, or twisting.  Not getting enough exercise.  Being overweight.  Using tobacco products. What are the signs or symptoms? Symptoms may vary depending on where your herniated disk is in your body.  A herniated disk in the lower back may cause sharp pain in: ? Part of the arm, leg, hip, or butt. ? The back of the lower leg (calf). ? The lower back, spreading down through the leg into the foot (sciatica).  A herniated disk in the neck may cause you to feel dizzy and feel that things around you are moving when they are not (vertigo). It may also cause pain or weakness in: ? The neck. ? The shoulder blades. ? The upper arm, lower arm, or fingers. You may also have muscle weakness. It may be hard to:  Lift your leg or arm.  Stand on your toes.  Squeeze tightly with one of your hands. Other symptoms may include:  Loss of feeling (numbness) or tingling in the affected areas of the hands, arms, feet, or legs.  Being unable to control when you pee (urinate), or when you poop (have a bowel movement). This is rare but serious. You may have a very bad  herniated disk in the lower back. How is this treated? This condition may be treated with:  A short period of rest. This is usually the first treatment. ? You may be on bed rest for up to 2 days, or you may be told to stay home and avoid being active. ? If you have a herniated disk in your lower back, limit how much you sit. Sitting puts more pressure on the disk.  Medicines. These may include: ? NSAIDs, such as ibuprofen, to help reduce pain and swelling. ? Muscle relaxants so that the back muscles do not tighten all of a sudden (spasm). ? Prescription pain medicines, if you have very bad pain.  Ice or heat therapy.  Steroid shots (injections) in the area of the herniated disk. These can help reduce pain and swelling.  Physical therapy to strengthen your back muscles. In many cases, symptoms go away with treatment after days or weeks have passed. Your symptoms will most likely be gone after 3-4 months. If other treatments do not help, you may need surgery. Follow these instructions at home: Medicines  Take over-the-counter and prescription medicines only as told by your doctor.  Ask your doctor if the medicine prescribed to you: ? Requires you to avoid driving or using heavy machinery. ? Can cause trouble pooping (constipation). You may need to take these actions to prevent or treat trouble pooping:  Drink enough fluid to keep your pee (urine) pale yellow.  Take over-the-counter or prescription medicines.  Eat foods that are high in fiber. These include beans, whole grains, and fresh fruits and vegetables.  Limit foods that are high in fat and processed sugars. These include fried or sweet foods. Managing pain, stiffness, and swelling      If told, put ice on the painful area. To do this: ? Put ice in a plastic bag. ? Place a towel between your skin and the bag. ? Leave the ice on for 20 minutes, 2-3 times a day.  If told, put heat on the painful area. Do this as often  as told by your doctor. Use the heat source that your doctor recommends, such as a moist heat pack or a heating pad. ? Place a towel between your  skin and the heat source. ? Leave the heat on for 20-30 minutes. ? Remove the heat if your skin turns bright red. This is very important if you are unable to feel pain, heat, or cold. You may have a greater risk of getting burned. Activity  Rest as told by your doctor.  After your rest period: ? Return to your normal activities as told by your doctor. Slowly start doing exercises as told. Ask your doctor what activities and exercises are safe for you. ? Use good posture. ? Avoid movements that cause pain. ? Do not lift anything that is heavier than 10 lb (4.5 kg), or the limit that you are told, until your doctor says that it is safe. ? Do not sit or stand for a long time without moving. ? Do not sit for a long time without getting up and moving around.  If exercises (physical therapy) were prescribed, do them as told by your doctor.  Try to strengthen your back and belly (abdomen) with exercises such as swimming or walking. General instructions  Do not use any products that contain nicotine or tobacco, such as cigarettes, e-cigarettes, and chewing tobacco. If you need help quitting, ask your doctor.  Do not wear high-heeled shoes.  Do not sleep on your belly.  If you are overweight, work with your doctor to lose weight safely.  Keep all follow-up visits as told by your doctor. This is important. How is this prevented?   Stay at a healthy weight.  Stay in shape. Do at least 150 minutes of moderate-intensity exercise each week, such as fast walking or water aerobics.  When lifting objects: ? Keep your feet as far apart as your shoulders or farther apart. ? Tighten your belly muscles. ? Bend your knees and hips, and keep your spine neutral. Lift using the strength of your legs, not your back. Do not lock your knees straight  out. ? Always ask for help to lift heavy or awkward objects. Contact a doctor if:  You have back pain or neck pain that does not get better after 6 weeks.  You have very bad pain in your back, neck, legs, or arms.  You get any of these problems in any part of your body: ? Tingling. ? Weakness. ? Loss of feeling. Get help right away if:  You cannot move your arms or legs.  You cannot control when you pee or poop.  You feel dizzy.  You faint.  You have trouble breathing. Summary  A herniated disk happens when a disk in your backbone bulges out too far.  This condition may be caused by wear and tear as you age or a sudden injury.  Symptoms may vary depending on where your herniated disk is in your body.  Treatment may include rest, medicines, ice or heat therapy, steroid shots, and exercises.  You may need surgery if other treatments do not help. This information is not intended to replace advice given to you by your health care provider. Make sure you discuss any questions you have with your health care provider. Document Revised: 06/03/2019 Document Reviewed: 06/03/2019 Elsevier Patient Education  [place].  Chronic Obstructive Pulmonary Disease  Chronic obstructive pulmonary disease (COPD) is a long-term (chronic) condition that affects the lungs. COPD is a general term that can be used to describe many different lung problems that cause lung swelling (inflammation) and limit airflow, including chronic bronchitis and emphysema. If you have COPD, your lung function will  probably never return to normal. In most cases, it gets worse over time. However, there are steps you can take to slow the progression of the disease and improve your quality of life. What are the causes? This condition may be caused by:  Smoking. This is the most common cause.  Certain genes passed down through families. What increases the risk? The following factors may make you more likely  to develop this condition:  Secondhand smoke from cigarettes, pipes, or cigars.  Exposure to chemicals and other irritants such as fumes and dust in the work environment.  Chronic lung conditions or infections. What are the signs or symptoms? Symptoms of this condition include:  Shortness of breath, especially during physical activity.  Chronic cough with a large amount of thick mucus. Sometimes the cough may not have any mucus (dry cough).  Wheezing.  Rapid breaths.  Gray or bluish discoloration (cyanosis) of the skin, especially in your fingers, toes, or lips.  Feeling tired (fatigue).  Weight loss.  Chest tightness.  Frequent infections.  Episodes when breathing symptoms become much worse (exacerbations).  Swelling in the ankles, feet, or legs. This may occur in later stages of the disease. How is this diagnosed? This condition is diagnosed based on:  Your medical history.  A physical exam. You may also have tests, including:  Lung (pulmonary) function tests. This may include a spirometry test, which measures your ability to exhale properly.  Chest X-ray.  CT scan.  Blood tests. How is this treated? This condition may be treated with:  Medicines. These may include inhaled rescue medicines to treat acute exacerbations as well as long-term, or maintenance, medicines to prevent flare-ups of COPD. ? Bronchodilators help treat COPD by dilating the airways to allow increased airflow and make your breathing more comfortable. ? Steroids can reduce airway inflammation and help prevent exacerbations.  Smoking cessation. If you smoke, your health care provider may ask you to quit, and may also recommend therapy or replacement products to help you quit.  Pulmonary rehabilitation. This may involve working with a team of health care providers and specialists, such as respiratory, occupational, and physical therapists.  Exercise and physical activity. These are beneficial  for nearly all people with COPD.  Nutrition therapy to gain weight, if you are underweight.  Oxygen. Supplemental oxygen therapy is only helpful if you have a low oxygen level in your blood (hypoxemia).  Lung surgery or transplant.  Palliative care. This is to help people with COPD feel comfortable when treatment is no longer working. Follow these instructions at home: Medicines  Take over-the-counter and prescription medicines (inhaled or pills) only as told by your health care provider.  Talk to your health care provider before taking any cough or allergy medicines. You may need to avoid certain medicines that dry out your airways. Lifestyle  If you are a smoker, the most important thing that you can do is to stop smoking. Do not use any products that contain nicotine or tobacco, such as cigarettes and e-cigarettes. If you need help quitting, ask your health care provider. Continuing to smoke will cause the disease to progress faster.  Avoid exposure to things that irritate your lungs, such as smoke, chemicals, and fumes.  Stay active, but balance activity with periods of rest. Exercise and physical activity will help you maintain your ability to do things you want to do.  Learn and use relaxation techniques to manage stress and to control your breathing.  Get the right amount of  sleep and get quality sleep. Most adults need 7 or more hours per night.  Eat healthy foods. Eating smaller, more frequent meals and resting before meals may help you maintain your strength. Controlled breathing Learn and use controlled breathing techniques as directed by your health care provider. Controlled breathing techniques include:  Pursed lip breathing. Start by breathing in (inhaling) through your nose for 1 second. Then, purse your lips as if you were going to whistle and breathe out (exhale) through the pursed lips for 2 seconds.  Diaphragmatic breathing. Start by putting one hand on your  abdomen just above your waist. Inhale slowly through your nose. The hand on your abdomen should move out. Then purse your lips and exhale slowly. You should be able to feel the hand on your abdomen moving in as you exhale. Controlled coughing Learn and use controlled coughing to clear mucus from your lungs. Controlled coughing is a series of short, progressive coughs. The steps of controlled coughing are: 1. Lean your head slightly forward. 2. Breathe in deeply using diaphragmatic breathing. 3. Try to hold your breath for 3 seconds. 4. Keep your mouth slightly open while coughing twice. 5. Spit any mucus out into a tissue. 6. Rest and repeat the steps once or twice as needed. General instructions  Make sure you receive all the vaccines that your health care provider recommends, especially the pneumococcal and influenza vaccines. Preventing infection and hospitalization is very important when you have COPD.  Use oxygen therapy and pulmonary rehabilitation if directed to by your health care provider. If you require home oxygen therapy, ask your health care provider whether you should purchase a pulse oximeter to measure your oxygen level at home.  Work with your health care provider to develop a COPD action plan. This will help you know what steps to take if your condition gets worse.  Keep other chronic health conditions under control as told by your health care provider.  Avoid extreme temperature and humidity changes.  Avoid contact with people who have an illness that spreads from person to person (is contagious), such as viral infections or pneumonia.  Keep all follow-up visits as told by your health care provider. This is important. Contact a health care provider if:  You are coughing up more mucus than usual.  There is a change in the color or thickness of your mucus.  Your breathing is more labored than usual.  Your breathing is faster than usual.  You have difficulty  sleeping.  You need to use your rescue medicines or inhalers more often than expected.  You have trouble doing routine activities such as getting dressed or walking around the house. Get help right away if:  You have shortness of breath while you are resting.  You have shortness of breath that prevents you from: ? Being able to talk. ? Performing your usual physical activities.  You have chest pain lasting longer than 5 minutes.  Your skin color is more blue (cyanotic) than usual.  You measure low oxygen saturations for longer than 5 minutes with a pulse oximeter.  You have a fever.  You feel too tired to breathe normally. Summary  Chronic obstructive pulmonary disease (COPD) is a long-term (chronic) condition that affects the lungs.  Your lung function will probably never return to normal. In most cases, it gets worse over time. However, there are steps you can take to slow the progression of the disease and improve your quality of life.  Treatment for COPD  may include taking medicines, quitting smoking, pulmonary rehabilitation, and changes to diet and exercise. As the disease progresses, you may need oxygen therapy, a lung transplant, or palliative care.  To help manage your condition, do not smoke, avoid exposure to things that irritate your lungs, stay up to date on all vaccines, and follow your health care provider's instructions for taking medicines. This information is not intended to replace advice given to you by your health care provider. Make sure you discuss any questions you have with your health care provider. Document Revised: 10/12/2017 Document Reviewed: 12/04/2016 Elsevier Patient Education  [place].  High Cholesterol  High cholesterol is a condition in which the blood has high levels of a white, waxy, fat-like substance (cholesterol). The human body needs small amounts of cholesterol. The liver makes all the cholesterol that the body needs. Extra  (excess) cholesterol comes from the food that we eat. Cholesterol is carried from the liver by the blood through the blood vessels. If you have high cholesterol, deposits (plaques) may build up on the walls of your blood vessels (arteries). Plaques make the arteries narrower and stiffer. Cholesterol plaques increase your risk for heart attack and stroke. Work with your health care provider to keep your cholesterol levels in a healthy range. What increases the risk? This condition is more likely to develop in people who:  Eat foods that are high in animal fat (saturated fat) or cholesterol.  Are overweight.  Are not getting enough exercise.  Have a family history of high cholesterol. What are the signs or symptoms? There are no symptoms of this condition. How is this diagnosed? This condition may be diagnosed from the results of a blood test.  If you are older than age 48, your health care provider may check your cholesterol every 4-6 years.  You may be checked more often if you already have high cholesterol or other risk factors for heart disease. The blood test for cholesterol measures:  "Bad" cholesterol (LDL cholesterol). This is the main type of cholesterol that causes heart disease. The desired level for LDL is less than 100.  "Good" cholesterol (HDL cholesterol). This type helps to protect against heart disease by cleaning the arteries and carrying the LDL away. The desired level for HDL is 60 or higher.  Triglycerides. These are fats that the body can store or burn for energy. The desired number for triglycerides is lower than 150.  Total cholesterol. This is a measure of the total amount of cholesterol in your blood, including LDL cholesterol, HDL cholesterol, and triglycerides. A healthy number is less than 200. How is this treated? This condition is treated with diet changes, lifestyle changes, and medicines. Diet changes  This may include eating more whole grains, fruits,  vegetables, nuts, and fish.  This may also include cutting back on red meat and foods that have a lot of added sugar. Lifestyle changes  Changes may include getting at least 40 minutes of aerobic exercise 3 times a week. Aerobic exercises include walking, biking, and swimming. Aerobic exercise along with a healthy diet can help you maintain a healthy weight.  Changes may also include quitting smoking. Medicines  Medicines are usually given if diet and lifestyle changes have failed to reduce your cholesterol to healthy levels.  Your health care provider may prescribe a statin medicine. Statin medicines have been shown to reduce cholesterol, which can reduce the risk of heart disease. Follow these instructions at home: Eating and drinking If told by  your health care provider:  Eat chicken (without skin), fish, veal, shellfish, ground [place] breast, and round or loin cuts of red meat.  Do not eat fried foods or fatty meats, such as hot dogs and salami.  Eat plenty of fruits, such as apples.  Eat plenty of vegetables, such as broccoli, potatoes, and carrots.  Eat beans, peas, and lentils.  Eat grains such as barley, rice, couscous, and bulgur wheat.  Eat pasta without cream sauces.  Use skim or nonfat milk, and eat low-fat or nonfat yogurt and cheeses.  Do not eat or drink whole milk, cream, ice cream, egg yolks, or hard cheeses.  Do not eat stick margarine or tub margarines that contain trans fats (also called partially hydrogenated oils).  Do not eat saturated tropical oils, such as coconut oil and palm oil.  Do not eat cakes, cookies, crackers, or other baked goods that contain trans fats.  General instructions  Exercise as directed by your health care provider. Increase your activity level with activities such as gardening, walking, and taking the stairs.  Take over-the-counter and prescription medicines only as told by your health care provider.  Do not use any  products that contain nicotine or tobacco, such as cigarettes and e-cigarettes. If you need help quitting, ask your health care provider.  Keep all follow-up visits as told by your health care provider. This is important. Contact a health care provider if:  You are struggling to maintain a healthy diet or weight.  You need help to start on an exercise program.  You need help to stop smoking. Get help right away if:  You have chest pain.  You have trouble breathing. This information is not intended to replace advice given to you by your health care provider. Make sure you discuss any questions you have with your health care provider. Document Revised: 11/02/2017 Document Reviewed: 04/29/2016 Elsevier Patient Education  [place].  Cholesterol Content in Foods Cholesterol is a waxy, fat-like substance that helps to carry fat in the blood. The body needs cholesterol in small amounts, but too much cholesterol can cause damage to the arteries and heart. Most people should eat less than 200 milligrams (mg) of cholesterol a day. Foods with cholesterol  Cholesterol is found in animal-based foods, such as meat, seafood, and dairy. Generally, low-fat dairy and lean meats have less cholesterol than full-fat dairy and fatty meats. The milligrams of cholesterol per serving (mg per serving) of common cholesterol-containing foods are listed below. Meat and other proteins  Egg -- one large whole egg has 186 mg.  Veal shank -- 4 oz has 141 mg.  Lean ground [place] (93% lean) -- 4 oz has 118 mg.  Fat-trimmed lamb loin -- 4 oz has 106 mg.  Lean ground beef (90% lean) -- 4 oz has 100 mg.  Lobster -- 3.5 oz has 90 mg.  Pork loin chops -- 4 oz has 86 mg.  Canned salmon -- 3.5 oz has 83 mg.  Fat-trimmed beef top loin -- 4 oz has 78 mg.  Frankfurter -- 1 frank (3.5 oz) has 77 mg.  Crab -- 3.5 oz has 71 mg.  Roasted chicken without skin, white meat -- 4 oz has 66 mg.  Light bologna  -- 2 oz has 45 mg.  Deli-cut [place] -- 2 oz has 31 mg.  Canned tuna -- 3.5 oz has 31 mg.  Berniece Salines -- 1 oz has 29 mg.  Oysters and mussels (raw) -- 3.5 oz has 25  mg.  Mackerel -- 1 oz has 22 mg.  Trout -- 1 oz has 20 mg.  Pork sausage -- 1 link (1 oz) has 17 mg.  Salmon -- 1 oz has 16 mg.  Tilapia -- 1 oz has 14 mg. Dairy  Soft-serve ice cream --  cup (4 oz) has 103 mg.  Whole-milk yogurt -- 1 cup (8 oz) has 29 mg.  Cheddar cheese -- 1 oz has 28 mg.  American cheese -- 1 oz has 28 mg.  Whole milk -- 1 cup (8 oz) has 23 mg.  2% milk -- 1 cup (8 oz) has 18 mg.  Cream cheese -- 1 tablespoon (Tbsp) has 15 mg.  Cottage cheese --  cup (4 oz) has 14 mg.  Low-fat (1%) milk -- 1 cup (8 oz) has 10 mg.  Sour cream -- 1 Tbsp has 8.5 mg.  Low-fat yogurt -- 1 cup (8 oz) has 8 mg.  Nonfat Greek yogurt -- 1 cup (8 oz) has 7 mg.  Half-and-half cream -- 1 Tbsp has 5 mg. Fats and oils  Cod liver oil -- 1 tablespoon (Tbsp) has 82 mg.  Butter -- 1 Tbsp has 15 mg.  Lard -- 1 Tbsp has 14 mg.  Bacon grease -- 1 Tbsp has 14 mg.  Mayonnaise -- 1 Tbsp has 5-10 mg.  Margarine -- 1 Tbsp has 3-10 mg. Exact amounts of cholesterol in these foods may vary depending on specific ingredients and brands. Foods without cholesterol Most plant-based foods do not have cholesterol unless you combine them with a food that has cholesterol. Foods without cholesterol include:  Grains and cereals.  Vegetables.  Fruits.  Vegetable oils, such as olive, canola, and sunflower oil.  Legumes, such as peas, beans, and lentils.  Nuts and seeds.  Egg whites. Summary  The body needs cholesterol in small amounts, but too much cholesterol can cause damage to the arteries and heart.  Most people should eat less than 200 milligrams (mg) of cholesterol a day. This information is not intended to replace advice given to you by your health care provider. Make sure you discuss any questions you have  with your health care provider. Document Revised: 10/12/2017 Document Reviewed: 06/26/2017 Elsevier Patient Education  [place].

## 2019-11-18 NOTE — Telephone Encounter (Signed)
He has appointment today at 1330
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
## 2019-11-19 ENCOUNTER — Telehealth: Payer: Self-pay | Admitting: Internal Medicine

## 2019-11-19 NOTE — Telephone Encounter (Signed)
Pt hip is still hurting him today he may be calling back to sch appt.

## 2019-11-20 NOTE — Telephone Encounter (Signed)
Inform pt [facility] in [place] has walk in clinic I think on saturdays for hip pain and the 1 here on M-F 1 pm to 5 pm if hip is bothering him   Ortho doctors recommended   [place]

## 2019-11-20 NOTE — Telephone Encounter (Signed)
Patient has been informed.

## 2019-12-22 ENCOUNTER — Telehealth: Payer: Self-pay | Admitting: Internal Medicine

## 2019-12-22 NOTE — Telephone Encounter (Signed)
Pt said he tried pepto bismol but will try prilosec. He is agreeable to a referral to GI.

## 2019-12-22 NOTE — Telephone Encounter (Signed)
Pt states that every time he eats, he has stomach pain and gurgling. He thinks he may have an ulcer. Pt wants to know if there is anything he can take.

## 2019-12-22 NOTE — Telephone Encounter (Signed)
Rec Gi referral is he agreeable?   He can try otc prilosec and peptobismol for now but rec above   [place]

## 2019-12-24 ENCOUNTER — Encounter: Payer: Self-pay | Admitting: Gastroenterology

## 2019-12-24 ENCOUNTER — Other Ambulatory Visit: Payer: Self-pay | Admitting: Internal Medicine

## 2019-12-24 DIAGNOSIS — K219 Gastro-esophageal reflux disease without esophagitis: Secondary | ICD-10-CM

## 2019-12-24 MED ORDER — PANTOPRAZOLE SODIUM 40 MG PO TBEC: 40.0000 mg | DELAYED_RELEASE_TABLET | Freq: Every day | ORAL | 3 refills | Status: DC

## 2019-12-24 NOTE — Telephone Encounter (Signed)
Left message to return call 

## 2019-12-24 NOTE — Telephone Encounter (Signed)
Patient has been informed. Juan [name] is getting address and updating demographics.

## 2019-12-24 NOTE — Addendum Note (Signed)
Addended by: Orland Mustard on: 12/24/2019 12:42 PM   Modules accepted: Orders

## 2019-12-24 NOTE — Telephone Encounter (Signed)
Sent protonix to [facility] in [place] [facility] in [place] they will call him for appt  We need address on file for him where is is living give to me and reply back   thanks [place]

## 2019-12-25 DIAGNOSIS — R3915 Urgency of urination: Secondary | ICD-10-CM | POA: Diagnosis not present

## 2020-01-22 ENCOUNTER — Ambulatory Visit: Payer: Medicare Other | Admitting: Gastroenterology

## 2020-01-22 ENCOUNTER — Encounter: Payer: Self-pay | Admitting: Gastroenterology

## 2020-01-22 VITALS — BP 110/70 | HR 60 | Ht 68.5 in | Wt 175.1 lb

## 2020-01-22 DIAGNOSIS — Z1212 Encounter for screening for malignant neoplasm of rectum: Secondary | ICD-10-CM | POA: Diagnosis not present

## 2020-01-22 DIAGNOSIS — K219 Gastro-esophageal reflux disease without esophagitis: Secondary | ICD-10-CM | POA: Diagnosis not present

## 2020-01-22 DIAGNOSIS — Z1211 Encounter for screening for malignant neoplasm of colon: Secondary | ICD-10-CM

## 2020-01-22 DIAGNOSIS — Z01818 Encounter for other preprocedural examination: Secondary | ICD-10-CM

## 2020-01-22 MED ORDER — NA SULFATE-K SULFATE-MG SULF 17.5-3.13-1.6 GM/177ML PO SOLN: 1.0000 | Freq: Once | ORAL | 0 refills | Status: AC

## 2020-01-22 NOTE — Patient Instructions (Signed)
You have been scheduled for a colonoscopy. Please follow written instructions given to you at your visit today.  Please pick up your prep supplies at the pharmacy within the next 1-3 days. If you use inhalers (even only as needed), please bring them with you on the day of your procedure.  Thank you for choosing me and  Gastroenterology.  Malcolm T. Stark, Jr., MD., FACG  
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
## 2020-01-30 ENCOUNTER — Encounter: Payer: Self-pay | Admitting: Gastroenterology

## 2020-02-05 ENCOUNTER — Ambulatory Visit (INDEPENDENT_AMBULATORY_CARE_PROVIDER_SITE_OTHER): Payer: Medicare Other

## 2020-02-05 ENCOUNTER — Other Ambulatory Visit: Payer: Self-pay | Admitting: Gastroenterology

## 2020-02-05 DIAGNOSIS — Z1159 Encounter for screening for other viral diseases: Secondary | ICD-10-CM | POA: Diagnosis not present

## 2020-02-06 LAB — SARS CORONAVIRUS 2 (TAT 6-24 HRS): SARS Coronavirus 2: NEGATIVE

## 2020-02-07 ENCOUNTER — Telehealth: Payer: Self-pay | Admitting: Nurse Practitioner

## 2020-02-07 NOTE — Telephone Encounter (Signed)
Patient called to review his suprep instructions as he does not have the instruction provided by our office. He will start his clear liquid diet tomorrow. Suprep first dose around 6pm 3/28. Second dose at 7am on 3/29 as he has a 2 pm colonoscopy. He verbalized understanding these instructions.

## 2020-02-09 ENCOUNTER — Other Ambulatory Visit: Payer: Self-pay

## 2020-02-09 ENCOUNTER — Ambulatory Visit (AMBULATORY_SURGERY_CENTER): Payer: Medicare Other | Admitting: Gastroenterology

## 2020-02-09 ENCOUNTER — Encounter: Payer: Self-pay | Admitting: Gastroenterology

## 2020-02-09 VITALS — BP 111/60 | HR 56 | Temp 96.8°F | Resp 22 | Ht 68.0 in | Wt 175.0 lb

## 2020-02-09 DIAGNOSIS — Z1212 Encounter for screening for malignant neoplasm of rectum: Secondary | ICD-10-CM | POA: Diagnosis not present

## 2020-02-09 DIAGNOSIS — Z1211 Encounter for screening for malignant neoplasm of colon: Secondary | ICD-10-CM

## 2020-02-09 DIAGNOSIS — D125 Benign neoplasm of sigmoid colon: Secondary | ICD-10-CM

## 2020-02-09 DIAGNOSIS — K635 Polyp of colon: Secondary | ICD-10-CM

## 2020-02-09 HISTORY — PX: COLONOSCOPY: SHX174

## 2020-02-09 MED ORDER — SODIUM CHLORIDE 0.9 % IV SOLN: 500.0000 mL | INTRAVENOUS | Status: DC

## 2020-02-09 NOTE — Patient Instructions (Signed)
Discharge instructions given. ?Handouts on polyps and Hemorrhoids. ?Resume previous medications. ?YOU HAD AN ENDOSCOPIC PROCEDURE TODAY AT THE [facility]:   Refer to the procedure report that was given to you for any specific questions about what was found during the examination.  If the procedure report does not answer your questions, please call your gastroenterologist to clarify.  If you requested that your care partner not be given the details of your procedure findings, then the procedure report has been included in a sealed envelope for you to review at your convenience later. ? ?YOU SHOULD EXPECT: Some feelings of bloating in the abdomen. Passage of more gas than usual.  Walking can help get rid of the air that was put into your GI tract during the procedure and reduce the bloating. If you had a lower endoscopy (such as a colonoscopy or flexible sigmoidoscopy) you may notice spotting of blood in your stool or on the toilet paper. If you underwent a bowel prep for your procedure, you may not have a normal bowel movement for a few days. ? ?Please Note:  You might notice some irritation and congestion in your nose or some drainage.  This is from the oxygen used during your procedure.  There is no need for concern and it should clear up in a day or so. ? ?SYMPTOMS TO REPORT IMMEDIATELY: ? ?Following lower endoscopy (colonoscopy or flexible sigmoidoscopy): ? Excessive amounts of blood in the stool ? Significant tenderness or worsening of abdominal pains ? Swelling of the abdomen that is new, acute ? Fever of 100?F or higher ? ? ?For urgent or emergent issues, a gastroenterologist can be reached at any hour by calling [phone]. ?Do not use MyChart messaging for urgent concerns.  ? ? ?DIET:  We do recommend a small meal at first, but then you may proceed to your regular diet.  Drink plenty of fluids but you should avoid alcoholic beverages for 24 hours. ? ?ACTIVITY:  You should plan to take it  easy for the rest of today and you should NOT DRIVE or use heavy machinery until tomorrow (because of the sedation medicines used during the test).   ? ?FOLLOW UP: ?Our staff will call the number listed on your records 48-72 hours following your procedure to check on you and address any questions or concerns that you may have regarding the information given to you following your procedure. If we do not reach you, we will leave a message.  We will attempt to reach you two times.  During this call, we will ask if you have developed any symptoms of COVID 19. If you develop any symptoms (ie: fever, flu-like symptoms, shortness of breath, cough etc.) before then, please call [phone].  If you test positive for Covid 19 in the 2 weeks post procedure, please call and report this information to us.   ? ?If any biopsies were taken you will be contacted by phone or by letter within the next 1-3 weeks.  Please call us at [phone] if you have not heard about the biopsies in 3 weeks.  ? ? ?SIGNATURES/CONFIDENTIALITY: ?You and/or your care partner have signed paperwork which will be entered into your electronic medical record.  These signatures attest to the fact that that the information above on your After Visit Summary has been reviewed and is understood.  Full responsibility of the confidentiality of this discharge information lies with you and/or your care-partner.  ?

## 2020-02-09 NOTE — Op Note (Signed)
[place] Patient Name: Juan [name] Procedure Date: 02/09/2020 3:04 PM MRN: [id number] Endoscopist: Ladene Artist , MD Age: 69 Referring MD:  Date of Birth: [birth year] Gender: Male Account #: [id number] Procedure:                Colonoscopy Indications:              Screening for colorectal malignant neoplasm Medicines:                Monitored Anesthesia Care Procedure:                Pre-Anesthesia Assessment:                           - Prior to the procedure, a History and Physical                            was performed, and patient medications and                            allergies were reviewed. The patient's tolerance of                            previous anesthesia was also reviewed. The risks                            and benefits of the procedure and the sedation                            options and risks were discussed with the patient.                            All questions were answered, and informed consent                            was obtained. Prior Anticoagulants: The patient has                            taken no previous anticoagulant or antiplatelet                            agents. ASA Grade Assessment: II - A patient with                            mild systemic disease. After reviewing the risks                            and benefits, the patient was deemed in                            satisfactory condition to undergo the procedure.                           After obtaining informed consent, the colonoscope  was passed under direct vision. Throughout the                            procedure, the patient's blood pressure, pulse, and                            oxygen saturations were monitored continuously. The                            Colonoscope was introduced through the anus and                            advanced to the the cecum, identified by                            appendiceal orifice and  ileocecal valve. The                            ileocecal valve, appendiceal orifice, and rectum                            were photographed. The quality of the bowel                            preparation was good. The patient tolerated the                            procedure well. The colonoscopy was somewhat                            difficult due to a redundant colon, significant                            looping and a tortuous colon. Scope In: 3:11:03 PM Scope Out: 3:33:23 PM Scope Withdrawal Time: 0 hours 8 minutes 23 seconds  Total Procedure Duration: 0 hours 22 minutes 20 seconds  Findings:                 The perianal and digital rectal examinations were                            normal.                           Three sessile polyps were found in the sigmoid                            colon. The polyps were 5 to 6 mm in size. These                            polyps were removed with a cold snare. Resection                            and retrieval were complete.  Internal hemorrhoids were found during                            retroflexion. The hemorrhoids were medium-sized and                            Grade I (internal hemorrhoids that do not prolapse).                           The exam was otherwise without abnormality on                            direct and retroflexion views. Complications:            No immediate complications. Estimated blood loss:                            None. Estimated Blood Loss:     Estimated blood loss: none. Impression:               - Three 5 to 6 mm polyps in the sigmoid colon,                            removed with a cold snare. Resected and retrieved.                           - Internal hemorrhoids.                           - The examination was otherwise normal on direct                            and retroflexion views. Recommendation:           - Repeat colonoscopy after studies are complete for                             surveillance based on pathology results.                           - Patient has a contact number available for                            emergencies. The signs and symptoms of potential                            delayed complications were discussed with the                            patient. Return to normal activities tomorrow.                            Written discharge instructions were provided to the                            patient.                           -  Resume previous diet.                           - Continue present medications.                           - Await pathology results. Ladene Artist, MD 02/09/2020 3:37:33 PM This report has been signed electronically.

## 2020-02-09 NOTE — Progress Notes (Signed)
Called to room to assist during endoscopic procedure.  Patient ID and intended procedure confirmed with present staff. Received instructions for my participation in the procedure from the performing physician.  

## 2020-02-09 NOTE — Progress Notes (Signed)
To PACU, VSS. Report to [email or web address] 

## 2020-02-09 NOTE — Progress Notes (Signed)
Pt's states no medical or surgical changes since previsit or office visit.  JB - temp DT - vitals 

## 2020-02-11 ENCOUNTER — Telehealth: Payer: Self-pay

## 2020-02-11 NOTE — Telephone Encounter (Signed)
  Follow up Call-  Call back number 02/09/2020  Post procedure Call Back phone  # [phone]  Permission to leave phone message Yes     Patient questions:  Do you have a fever, pain , or abdominal swelling? No. Pain Score  0 *  Have you tolerated food without any problems? Yes.    Have you been able to return to your normal activities? Yes.    Do you have any questions about your discharge instructions: Diet   No. Medications  No. Follow up visit  No.  Do you have questions or concerns about your Care? No.  Actions: * If pain score is 4 or above: No action needed, pain <4.Covid-19 screening questions   Do you now or have you had a fever in the last 14 days? No.  Do you have any respiratory symptoms of shortness of breath or cough now or in the last 14 days? No.  Do you have any family members or close contacts with diagnosed or suspected Covid-19 in the past 14 days? No.  Have you been tested for Covid-19 and found to be positive? No.

## 2020-02-12 ENCOUNTER — Encounter: Payer: Self-pay | Admitting: Gastroenterology

## 2020-02-23 DIAGNOSIS — H40023 Open angle with borderline findings, high risk, bilateral: Secondary | ICD-10-CM | POA: Diagnosis not present

## 2020-04-20 ENCOUNTER — Telehealth: Payer: Self-pay | Admitting: Internal Medicine

## 2020-04-20 NOTE — Telephone Encounter (Signed)
Pt needs to call [facility] to discuss viagra ive never Rx this for him Given # [facility] established with Dr. Gloriann Loan [phone]   [place]

## 2020-04-20 NOTE — Telephone Encounter (Signed)
Patient is requesting a prescription for Viagra. Pt's wife said he did not want to call himself.

## 2020-04-21 NOTE — Telephone Encounter (Signed)
Patient informed and verbalized understanding.  Gave him the number [phone] to call Urology for refills.

## 2020-05-18 ENCOUNTER — Ambulatory Visit (INDEPENDENT_AMBULATORY_CARE_PROVIDER_SITE_OTHER): Payer: Medicare Other | Admitting: Internal Medicine

## 2020-05-18 ENCOUNTER — Other Ambulatory Visit: Payer: Self-pay

## 2020-05-18 ENCOUNTER — Encounter: Payer: Self-pay | Admitting: Internal Medicine

## 2020-05-18 VITALS — BP 106/70 | HR 79 | Temp 98.1°F | Ht 68.0 in | Wt 164.8 lb

## 2020-05-18 DIAGNOSIS — D696 Thrombocytopenia, unspecified: Secondary | ICD-10-CM

## 2020-05-18 DIAGNOSIS — M5416 Radiculopathy, lumbar region: Secondary | ICD-10-CM

## 2020-05-18 DIAGNOSIS — I700 Atherosclerosis of aorta: Secondary | ICD-10-CM | POA: Diagnosis not present

## 2020-05-18 DIAGNOSIS — R918 Other nonspecific abnormal finding of lung field: Secondary | ICD-10-CM | POA: Diagnosis not present

## 2020-05-18 DIAGNOSIS — E538 Deficiency of other specified B group vitamins: Secondary | ICD-10-CM

## 2020-05-18 DIAGNOSIS — W57XXXA Bitten or stung by nonvenomous insect and other nonvenomous arthropods, initial encounter: Secondary | ICD-10-CM

## 2020-05-18 DIAGNOSIS — E611 Iron deficiency: Secondary | ICD-10-CM

## 2020-05-18 DIAGNOSIS — K635 Polyp of colon: Secondary | ICD-10-CM

## 2020-05-18 DIAGNOSIS — R5383 Other fatigue: Secondary | ICD-10-CM | POA: Diagnosis not present

## 2020-05-18 DIAGNOSIS — R634 Abnormal weight loss: Secondary | ICD-10-CM

## 2020-05-18 DIAGNOSIS — M160 Bilateral primary osteoarthritis of hip: Secondary | ICD-10-CM

## 2020-05-18 DIAGNOSIS — I359 Nonrheumatic aortic valve disorder, unspecified: Secondary | ICD-10-CM

## 2020-05-18 DIAGNOSIS — Z720 Tobacco use: Secondary | ICD-10-CM

## 2020-05-18 MED ORDER — TRIAMCINOLONE ACETONIDE 0.1 % EX CREA: 1.0000 "application " | TOPICAL_CREAM | Freq: Two times a day (BID) | CUTANEOUS | 0 refills | Status: DC

## 2020-05-18 NOTE — Progress Notes (Signed)
Patient presenting for a follow up. States he is experiencing weight loss and fatigue. States he does not eat very much and would like his vitamin levels checked.   Patient is also having leg cramping, right hip pain, and back pain. Pain is rated 8/10.   Patient flagged: Current status:  PATIENT IS OVERDUE FOR BMI FOLLOW UP PLAN BMI is estimated to be 25.1 based on the last recorded weight and height

## 2020-05-18 NOTE — Patient Instructions (Addendum)
If need you can take senna/colace over the counter for constipation or Miralax powder over the counter  Increase water intake 55-64 ounces for leg cramps Consider theraworx over the counter spray   If you do want covid shot consider Pfizer x 2 doses ([facility] or [facility])  Consider back injection with a specialist for pain   Thoracic Strain Rehab Ask your health care provider which exercises are safe for you. Do exercises exactly as told by your health care provider and adjust them as directed. It is normal to feel mild stretching, pulling, tightness, or discomfort as you do these exercises. Stop right away if you feel sudden pain or your pain gets worse. Do not begin these exercises until told by your health care provider. Stretching and range-of-motion exercise This exercise warms up your muscles and joints and improves the movement and flexibility of your back and shoulders. This exercise also helps to relieve pain. Chest and spine stretch  1. Lie down on your back on a firm surface. 2. Roll a towel or a small blanket so it is about 4 inches (10 cm) in diameter. 3. Put the towel lengthwise under the middle of your back so it is under your spine, but not under your shoulder blades. 4. Put your hands behind your head and let your elbows fall to your sides. This will increase your stretch. 5. Take a deep breath (inhale). 6. Hold for __________ seconds. 7. Relax after you breathe out (exhale). Repeat __________ times. Complete this exercise __________ times a day. Strengthening exercises These exercises build strength and endurance in your back and your shoulder blade muscles. Endurance is the ability to use your muscles for a long time, even after they get tired. Alternating arm and leg raises  1. Get on your hands and knees on a firm surface. If you are on a hard floor, you may want to use padding, such as an exercise mat, to cushion your knees. 2. Line up your arms and legs. Your hands  should be directly below your shoulders, and your knees should be directly below your hips. 3. Lift your left leg behind you. At the same time, raise your right arm and straighten it in front of you. ? Do not lift your leg higher than your hip. ? Do not lift your arm higher than your shoulder. ? Keep your abdominal and back muscles tight. ? Keep your hips facing the ground. ? Do not arch your back. ? Keep your balance carefully, and do not hold your breath. 4. Hold for __________ seconds. 5. Slowly return to the starting position and repeat with your right leg and your left arm. Repeat __________ times. Complete this exercise __________ times a day. Straight arm rows This exercise is also called shoulder extension exercise. 1. Stand with your feet shoulder width apart. 2. Secure an exercise band to a stable object in front of you so the band is at or above shoulder height. 3. Hold one end of the exercise band in each hand. 4. Straighten your elbows and lift your hands up to shoulder height. 5. Step back, away from the secured end of the exercise band, until the band stretches. 6. Squeeze your shoulder blades together and pull your hands down to the sides of your thighs. Stop when your hands are straight down by your sides. This is shoulder extension. Do not let your hands go behind your body. 7. Hold for __________ seconds. 8. Slowly return to the starting position. Repeat __________  times. Complete this exercise __________ times a day. Prone shoulder external rotation 1. Lie on your abdomen on a firm bed so your left / right forearm hangs over the edge of the bed and your upper arm is on the bed, straight out from your body. This is the prone position. ? Your elbow should be bent. ? Your palm should be facing your feet. 2. If instructed, hold a __________ weight in your hand. 3. Squeeze your shoulder blade toward the middle of your back. Do not let your shoulder lift toward your  ear. 4. Keep your elbow bent in a 90-degree angle (right angle) while you slowly move your forearm up toward the ceiling. Move your forearm up to the height of the bed, toward your head. This is external rotation. ? Your upper arm should not move. ? At the top of the movement, your palm should face the floor. 5. Hold for __________ seconds. 6. Slowly return to the starting position and relax your muscles. Repeat __________ times. Complete this exercise __________ times a day. Rowing scapular retraction This is an exercise in which the shoulder blades (scapulae) are pulled toward each other (retraction). 1. Sit in a stable chair without armrests, or stand up. 2. Secure an exercise band to a stable object in front of you so the band is at shoulder height. 3. Hold one end of the exercise band in each hand. Your palms should face down. 4. Bring your arms out straight in front of you. 5. Step back, away from the secured end of the exercise band, until the band stretches. 6. Pull the band backward. As you do this, bend your elbows and squeeze your shoulder blades together, but avoid letting the rest of your body move. Do not shrug your shoulders upward while you do this. 7. Stop when your elbows are at your sides or slightly behind your body. 8. Hold for __________ seconds. 9. Slowly straighten your arms to return to the starting position. Repeat __________ times. Complete this exercise __________ times a day. Posture and body mechanics Good posture and healthy body mechanics can help to relieve stress in your body's tissues and joints. Body mechanics refers to the movements and positions of your body while you do your daily activities. Posture is part of body mechanics. Good posture means:  Your spine is in its natural S-curve position (neutral).  Your shoulders are pulled back slightly.  Your head is not tipped forward. Follow these guidelines to improve your posture and body mechanics in your  everyday activities. Standing   When standing, keep your spine neutral and your feet about hip width apart. Keep a slight bend in your knees. Your ears, shoulders, and hips should line up with each other.  When you do a task in which you lean forward while standing in one place for a long time, place one foot up on a stable object that is 2-4 inches (5-10 cm) high, such as a footstool. This helps keep your spine neutral. Sitting   When sitting, keep your spine neutral and keep your feet flat on the floor. Use a footrest, if necessary, and keep your thighs parallel to the floor. Avoid rounding your shoulders, and avoid tilting your head forward.  When working at a desk or a computer, keep your desk at a height where your hands are slightly lower than your elbows. Slide your chair under your desk so you are close enough to maintain good posture.  When working at a computer,  place your monitor at a height where you are looking straight ahead and you do not have to tilt your head forward or downward to look at the screen. Resting When lying down and resting, avoid positions that are most painful for you.  If you have pain with activities such as sitting, bending, stooping, or squatting (flexion-basedactivities), lie in a position in which your body does not bend very much. For example, avoid curling up on your side with your arms and knees near your chest (fetal position).  If you have pain with activities such as standing for a long time or reaching with your arms (extension-basedactivities), lie with your spine in a neutral position and bend your knees slightly. Try the following positions: ? Lie on your side with a pillow between your knees. ? Lie on your back with a pillow under your knees.  Lifting   When lifting objects, keep your feet at least shoulder width apart and tighten your abdominal muscles.  Bend your knees and hips and keep your spine neutral. It is important to lift using  the strength of your legs, not your back. Do not lock your knees straight out.  Always ask for help to lift heavy or awkward objects. This information is not intended to replace advice given to you by your health care provider. Make sure you discuss any questions you have with your health care provider. Document Revised: 02/21/2019 Document Reviewed: 12/09/2018 Elsevier Patient Education  [place].  Back Exercises These exercises help to make your trunk and back strong. They also help to keep the lower back flexible. Doing these exercises can help to prevent back pain or lessen existing pain.  If you have back pain, try to do these exercises 2-3 times each day or as told by your doctor.  As you get better, do the exercises once each day. Repeat the exercises more often as told by your doctor.  To stop back pain from coming back, do the exercises once each day, or as told by your doctor. Exercises Single knee to chest Do these steps 3-5 times in a row for each leg: 6. Lie on your back on a firm bed or the floor with your legs stretched out. 7. Bring one knee to your chest. 8. Grab your knee or thigh with both hands and hold them it in place. 9. Pull on your knee until you feel a gentle stretch in your lower back or buttocks. 10. Keep doing the stretch for 10-30 seconds. 11. Slowly let go of your leg and straighten it. Pelvic tilt Do these steps 5-10 times in a row: 9. Lie on your back on a firm bed or the floor with your legs stretched out. 10. Bend your knees so they point up to the ceiling. Your feet should be flat on the floor. 11. Tighten your lower belly (abdomen) muscles to press your lower back against the floor. This will make your tailbone point up to the ceiling instead of pointing down to your feet or the floor. 12. Stay in this position for 5-10 seconds while you gently tighten your muscles and breathe evenly. Cat-cow Do these steps until your lower back bends more  easily: 7. Get on your hands and knees on a firm surface. Keep your hands under your shoulders, and keep your knees under your hips. You may put padding under your knees. 8. Let your head hang down toward your chest. Tighten (contract) the muscles in your belly. Point your  tailbone toward the floor so your lower back becomes rounded like the back of a cat. 9. Stay in this position for 5 seconds. 10. Slowly lift your head. Let the muscles of your belly relax. Point your tailbone up toward the ceiling so your back forms a sagging arch like the back of a cow. 11. Stay in this position for 5 seconds.  Press-ups Do these steps 5-10 times in a row: 10. Lie on your belly (face-down) on the floor. 11. Place your hands near your head, about shoulder-width apart. 12. While you keep your back relaxed and keep your hips on the floor, slowly straighten your arms to raise the top half of your body and lift your shoulders. Do not use your back muscles. You may change where you place your hands in order to make yourself more comfortable. 13. Stay in this position for 5 seconds. 14. Slowly return to lying flat on the floor.  Bridges Do these steps 10 times in a row: 1. Lie on your back on a firm surface. 2. Bend your knees so they point up to the ceiling. Your feet should be flat on the floor. Your arms should be flat at your sides, next to your body. 3. Tighten your butt muscles and lift your butt off the floor until your waist is almost as high as your knees. If you do not feel the muscles working in your butt and the back of your thighs, slide your feet 1-2 inches farther away from your butt. 4. Stay in this position for 3-5 seconds. 5. Slowly lower your butt to the floor, and let your butt muscles relax. If this exercise is too easy, try doing it with your arms crossed over your chest. Belly crunches Do these steps 5-10 times in a row: 1. Lie on your back on a firm bed or the floor with your legs  stretched out. 2. Bend your knees so they point up to the ceiling. Your feet should be flat on the floor. 3. Cross your arms over your chest. 4. Tip your chin a little bit toward your chest but do not bend your neck. 5. Tighten your belly muscles and slowly raise your chest just enough to lift your shoulder blades a tiny bit off of the floor. Avoid raising your body higher than that, because it can put too much stress on your low back. 6. Slowly lower your chest and your head to the floor. Back lifts Do these steps 5-10 times in a row: 1. Lie on your belly (face-down) with your arms at your sides, and rest your forehead on the floor. 2. Tighten the muscles in your legs and your butt. 3. Slowly lift your chest off of the floor while you keep your hips on the floor. Keep the back of your head in line with the curve in your back. Look at the floor while you do this. 4. Stay in this position for 3-5 seconds. 5. Slowly lower your chest and your face to the floor. Contact a doctor if:  Your back pain gets a lot worse when you do an exercise.  Your back pain does not get better 2 hours after you exercise. If you have any of these problems, stop doing the exercises. Do not do them again unless your doctor says it is okay. Get help right away if:  You have sudden, very bad back pain. If this happens, stop doing the exercises. Do not do them again unless your doctor says  it is okay. This information is not intended to replace advice given to you by your health care provider. Make sure you discuss any questions you have with your health care provider. Document Revised: 07/25/2018 Document Reviewed: 07/25/2018 Elsevier Patient Education  [place].   Leg Cramps Leg cramps occur when one or more muscles tighten and you have no control over this tightening (involuntary muscle contraction). Muscle cramps can develop in any muscle, but the most common place is in the calf muscles of the leg.  Those cramps can occur during exercise or when you are at rest. Leg cramps are painful, and they may last for a few seconds to a few minutes. Cramps may return several times before they finally stop. Usually, leg cramps are not caused by a serious medical problem. In many cases, the cause is not known. Some common causes include:  Excessive physical effort (overexertion), such as during intense exercise.  Overuse from repetitive motions, or doing the same thing over and over.  Staying in a certain position for a long period of time.  Improper preparation, form, or technique while performing a sport or an activity.  Dehydration.  Injury.  Side effects of certain medicines.  Abnormally low levels of minerals in your blood (electrolytes), especially potassium and calcium. This could result from: ? Pregnancy. ? Taking diuretic medicines. Follow these instructions at home: Eating and drinking  Drink enough fluid to keep your urine pale yellow. Staying hydrated may help prevent cramps.  Eat a healthy diet that includes plenty of nutrients to help your muscles function. A healthy diet includes fruits and vegetables, lean protein, whole grains, and low-fat or nonfat dairy products. Managing pain, stiffness, and swelling      Try massaging, stretching, and relaxing the affected muscle. Do this for several minutes at a time.  If directed, put ice on areas that are sore or painful after a cramp: ? Put ice in a plastic bag. ? Place a towel between your skin and the bag. ? Leave the ice on for 20 minutes, 2-3 times a day.  If directed, apply heat to muscles that are tense or tight. Do this before you exercise, or as often as told by your health care provider. Use the heat source that your health care provider recommends, such as a moist heat pack or a heating pad. ? Place a towel between your skin and the heat source. ? Leave the heat on for 20-30 minutes. ? Remove the heat if your skin  turns bright red. This is especially important if you are unable to feel pain, heat, or cold. You may have a greater risk of getting burned.  Try taking hot showers or baths to help relax tight muscles. General instructions  If you are having frequent leg cramps, avoid intense exercise for several days.  Take over-the-counter and prescription medicines only as told by your health care provider.  Keep all follow-up visits as told by your health care provider. This is important. Contact a health care provider if:  Your leg cramps get more severe or more frequent, or they do not improve over time.  Your foot becomes cold, numb, or blue. Summary  Muscle cramps can develop in any muscle, but the most common place is in the calf muscles of the leg.  Leg cramps are painful, and they may last for a few seconds to a few minutes.  Usually, leg cramps are not caused by a serious medical problem. Often, the cause  is not known.  Stay hydrated and take over-the-counter and prescription medicines only as told by your health care provider. This information is not intended to replace advice given to you by your health care provider. Make sure you discuss any questions you have with your health care provider. Document Revised: 10/12/2017 Document Reviewed: 08/09/2017 Elsevier Patient Education  2020 [employer].
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
## 2020-05-19 DIAGNOSIS — M160 Bilateral primary osteoarthritis of hip: Secondary | ICD-10-CM | POA: Insufficient documentation

## 2020-05-19 DIAGNOSIS — K635 Polyp of colon: Secondary | ICD-10-CM | POA: Insufficient documentation

## 2020-05-19 DIAGNOSIS — M5416 Radiculopathy, lumbar region: Secondary | ICD-10-CM | POA: Insufficient documentation

## 2020-05-19 LAB — CBC WITH DIFFERENTIAL/PLATELET
Basophils Absolute: 0 10*3/uL (ref 0.0–0.1)
Basophils Relative: 0.6 % (ref 0.0–3.0)
Eosinophils Absolute: 0.1 10*3/uL (ref 0.0–0.7)
Eosinophils Relative: 0.8 % (ref 0.0–5.0)
HCT: 43 % (ref 39.0–52.0)
Hemoglobin: 14.6 g/dL (ref 13.0–17.0)
Lymphocytes Relative: 25 % (ref 12.0–46.0)
Lymphs Abs: 1.9 10*3/uL (ref 0.7–4.0)
MCHC: 34 g/dL (ref 30.0–36.0)
MCV: 100.1 fl — ABNORMAL HIGH (ref 78.0–100.0)
Monocytes Absolute: 0.7 10*3/uL (ref 0.1–1.0)
Monocytes Relative: 8.9 % (ref 3.0–12.0)
Neutro Abs: 5 10*3/uL (ref 1.4–7.7)
Neutrophils Relative %: 64.7 % (ref 43.0–77.0)
Platelets: 149 10*3/uL — ABNORMAL LOW (ref 150.0–400.0)
RBC: 4.29 Mil/uL (ref 4.22–5.81)
RDW: 15 % (ref 11.5–15.5)
WBC: 7.7 10*3/uL (ref 4.0–10.5)

## 2020-05-19 LAB — COMPREHENSIVE METABOLIC PANEL
ALT: 12 U/L (ref 0–53)
AST: 22 U/L (ref 0–37)
Albumin: 4.4 g/dL (ref 3.5–5.2)
Alkaline Phosphatase: 70 U/L (ref 39–117)
BUN: 20 mg/dL (ref 6–23)
CO2: 27 mEq/L (ref 19–32)
Calcium: 9.8 mg/dL (ref 8.4–10.5)
Chloride: 103 mEq/L (ref 96–112)
Creatinine, Ser: 0.94 mg/dL (ref 0.40–1.50)
GFR: 95.87 mL/min (ref >60.00)
Glucose, Bld: 107 mg/dL — ABNORMAL HIGH (ref 70–99)
Potassium: 4.2 mEq/L (ref 3.5–5.1)
Sodium: 138 mEq/L (ref 135–145)
Total Bilirubin: 0.6 mg/dL (ref 0.2–1.2)
Total Protein: 7.2 g/dL (ref 6.0–8.3)

## 2020-05-19 LAB — VITAMIN B12: Vitamin B-12: 212 pg/mL (ref 211–911)

## 2020-05-19 LAB — IRON,TIBC AND FERRITIN PANEL
%SAT: 33 % (calc) (ref 20–48)
Ferritin: 83 ng/mL (ref 24–380)
Iron: 99 ug/dL (ref 50–180)
TIBC: 303 mcg/dL (calc) (ref 250–425)

## 2020-05-19 LAB — FOLATE: Folate: 22.8 ng/mL (ref >5.9)

## 2020-05-19 LAB — TSH: TSH: 1.29 u[IU]/mL (ref 0.35–4.50)

## 2020-05-31 ENCOUNTER — Ambulatory Visit: Payer: Medicare Other

## 2020-05-31 ENCOUNTER — Ambulatory Visit
Admission: RE | Admit: 2020-05-31 | Discharge: 2020-05-31 | Disposition: A | Discharge status: Home / Self Care | Payer: Medicare Other | Source: Ambulatory Visit | Attending: Internal Medicine | Admitting: Internal Medicine

## 2020-05-31 ENCOUNTER — Other Ambulatory Visit: Payer: Self-pay

## 2020-05-31 DIAGNOSIS — R918 Other nonspecific abnormal finding of lung field: Secondary | ICD-10-CM | POA: Diagnosis not present

## 2020-05-31 DIAGNOSIS — I700 Atherosclerosis of aorta: Secondary | ICD-10-CM | POA: Diagnosis not present

## 2020-05-31 DIAGNOSIS — Z720 Tobacco use: Secondary | ICD-10-CM | POA: Diagnosis not present

## 2020-05-31 DIAGNOSIS — J40 Bronchitis, not specified as acute or chronic: Secondary | ICD-10-CM | POA: Diagnosis not present

## 2020-05-31 DIAGNOSIS — R634 Abnormal weight loss: Secondary | ICD-10-CM | POA: Diagnosis present

## 2020-05-31 DIAGNOSIS — J9809 Other diseases of bronchus, not elsewhere classified: Secondary | ICD-10-CM | POA: Diagnosis not present

## 2020-05-31 DIAGNOSIS — J432 Centrilobular emphysema: Secondary | ICD-10-CM | POA: Diagnosis not present

## 2020-06-01 ENCOUNTER — Encounter: Payer: Self-pay | Admitting: Internal Medicine

## 2020-06-01 DIAGNOSIS — I700 Atherosclerosis of aorta: Secondary | ICD-10-CM | POA: Insufficient documentation

## 2020-06-01 NOTE — Addendum Note (Signed)
Addended by: Orland Mustard on: 06/01/2020 06:00 PM   Modules accepted: Orders

## 2020-07-02 ENCOUNTER — Other Ambulatory Visit: Payer: Medicare Other

## 2020-07-05 ENCOUNTER — Ambulatory Visit: Payer: Medicare Other

## 2020-07-12 ENCOUNTER — Other Ambulatory Visit: Payer: Self-pay

## 2020-07-12 ENCOUNTER — Ambulatory Visit
Admission: RE | Admit: 2020-07-12 | Discharge: 2020-07-12 | Disposition: A | Discharge status: Home / Self Care | Payer: Medicare Other | Source: Ambulatory Visit | Attending: Internal Medicine | Admitting: Internal Medicine

## 2020-07-12 DIAGNOSIS — I700 Atherosclerosis of aorta: Secondary | ICD-10-CM | POA: Insufficient documentation

## 2020-07-12 DIAGNOSIS — I359 Nonrheumatic aortic valve disorder, unspecified: Secondary | ICD-10-CM | POA: Insufficient documentation

## 2020-07-12 DIAGNOSIS — I517 Cardiomegaly: Secondary | ICD-10-CM | POA: Insufficient documentation

## 2020-07-12 LAB — ECHOCARDIOGRAM COMPLETE
AR max vel: 2.04 cm2
AV Area VTI: 2.24 cm2
AV Area mean vel: 2.1 cm2
AV Mean grad: 4.5 mmHg
AV Peak grad: 8.4 mmHg
Ao pk vel: 1.45 m/s
Area-P 1/2: 2.11 cm2
S' Lateral: 2.49 cm

## 2020-07-12 NOTE — Progress Notes (Signed)
*  PRELIMINARY RESULTS* Echocardiogram 2D Echocardiogram has been performed.  Juan [name] 07/12/2020, 11:52 AM

## 2020-07-22 ENCOUNTER — Other Ambulatory Visit: Payer: Self-pay | Admitting: Internal Medicine

## 2020-07-22 DIAGNOSIS — M545 Low back pain, unspecified: Secondary | ICD-10-CM

## 2020-07-22 NOTE — Telephone Encounter (Signed)
Called to give patient imaging results. Patient informed and verbalized understanding.   Patient then wanted to ask for a refill on a medication for back pain. Meloxicam pended. Please advise

## 2020-07-25 MED ORDER — MELOXICAM 7.5 MG PO TABS: 7.5000 mg | ORAL_TABLET | Freq: Two times a day (BID) | ORAL | 5 refills | Status: DC | PRN

## 2020-08-09 ENCOUNTER — Ambulatory Visit (INDEPENDENT_AMBULATORY_CARE_PROVIDER_SITE_OTHER): Payer: Medicare Other

## 2020-08-09 ENCOUNTER — Encounter (INDEPENDENT_AMBULATORY_CARE_PROVIDER_SITE_OTHER): Payer: Self-pay

## 2020-08-09 ENCOUNTER — Ambulatory Visit: Payer: Medicare Other

## 2020-08-09 VITALS — Ht 68.0 in | Wt 164.0 lb

## 2020-08-09 DIAGNOSIS — Z0000 Encounter for general adult medical examination without abnormal findings: Secondary | ICD-10-CM
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
## 2020-08-09 NOTE — Patient Instructions (Addendum)
Mr. Juan [name] , Thank you for taking time to come for your Medicare Wellness Visit. I appreciate your ongoing commitment to your health goals. Please review the following plan we discussed and let me know if I can assist you in the future.   These are the goals we discussed: Goals      Patient Stated   .  I would like to maintain or gain weight (pt-stated)      Stay active       This is a list of the screening recommended for you and due dates:  Health Maintenance  Topic Date Due  . COVID-19 Vaccine (1) 09/21/2020*  . Tetanus Vaccine  08/09/2021*  . Pneumonia vaccines (1 of 2 - PCV13) 08/09/2021*  . Colon Cancer Screening  02/08/2030  .  Hepatitis C: One time screening is recommended by Center for Disease Control  (CDC) for  adults born from [birth year] through 1966.   Completed  . Flu Shot  Discontinued  *Topic was postponed. The date shown is not the original due date.    Immunizations  There is no immunization history on file for this patient.  Keep all routine maintenance appointments.   Follow up 09/21/20 @ 2:00  Advanced directives: not yet completed.   Conditions/risks identified: none new.  Follow up in one year for your annual wellness visit.   Preventive Care 70 Years and Older, Male Preventive care refers to lifestyle choices and visits with your health care provider that can promote health and wellness. What does preventive care include?  A yearly physical exam. This is also called an annual well check.  Dental exams once or twice a year.  Routine eye exams. Ask your health care provider how often you should have your eyes checked.  Personal lifestyle choices, including:  Daily care of your teeth and gums.  Regular physical activity.  Eating a healthy diet.  Avoiding tobacco and drug use.  Limiting alcohol use.  Practicing safe sex.  Taking low doses of aspirin every day.  Taking vitamin and mineral supplements as recommended by your health care  provider. What happens during an annual well check? The services and screenings done by your health care provider during your annual well check will depend on your age, overall health, lifestyle risk factors, and family history of disease. Counseling  Your health care provider may ask you questions about your:  Alcohol use.  Tobacco use.  Drug use.  Emotional well-being.  Home and relationship well-being.  Sexual activity.  Eating habits.  History of falls.  Memory and ability to understand (cognition).  Work and work Statistician. Screening  You may have the following tests or measurements:  Height, weight, and BMI.  Blood pressure.  Lipid and cholesterol levels. These may be checked every 5 years, or more frequently if you are over 70 years old.  Skin check.  Lung cancer screening. You may have this screening every year starting at age 70 if you have a 30-pack-year history of smoking and currently smoke or have quit within the past 15 years.  Fecal occult blood test (FOBT) of the stool. You may have this test every year starting at age 70.  Flexible sigmoidoscopy or colonoscopy. You may have a sigmoidoscopy every 5 years or a colonoscopy every 10 years starting at age 70.  Prostate cancer screening. Recommendations will vary depending on your family history and other risks.  Hepatitis C blood test.  Hepatitis B blood test.  Sexually transmitted disease (STD) testing.  Diabetes screening. This is done by checking your blood sugar (glucose) after you have not eaten for a while (fasting). You may have this done every 1-3 years.  Abdominal aortic aneurysm (AAA) screening. You may need this if you are a current or former smoker.  Osteoporosis. You may be screened starting at age 70 if you are at high risk. Talk with your health care provider about your test results, treatment options, and if necessary, the need for more tests. Vaccines  Your health care provider  may recommend certain vaccines, such as:  Influenza vaccine. This is recommended every year.  Tetanus, diphtheria, and acellular pertussis (Tdap, Td) vaccine. You may need a Td booster every 10 years.  Zoster vaccine. You may need this after age 70.  Pneumococcal 13-valent conjugate (PCV13) vaccine. One dose is recommended after age 70.  Pneumococcal polysaccharide (PPSV23) vaccine. One dose is recommended after age 70. Talk to your health care provider about which screenings and vaccines you need and how often you need them. This information is not intended to replace advice given to you by your health care provider. Make sure you discuss any questions you have with your health care provider. Document Released: 11/26/2015 Document Revised: 07/19/2016 Document Reviewed: 08/31/2015 Elsevier Interactive Patient Education  2017 [place] Prevention in the Home Falls can cause injuries. They can happen to people of all ages. There are many things you can do to make your home safe and to help prevent falls. What can I do on the outside of my home?  Regularly fix the edges of walkways and driveways and fix any cracks.  Remove anything that might make you trip as you walk through a door, such as a raised step or threshold.  Trim any bushes or trees on the path to your home.  Use bright outdoor lighting.  Clear any walking paths of anything that might make someone trip, such as rocks or tools.  Regularly check to see if handrails are loose or broken. Make sure that both sides of any steps have handrails.  Any raised decks and porches should have guardrails on the edges.  Have any leaves, snow, or ice cleared regularly.  Use sand or salt on walking paths during winter.  Clean up any spills in your garage right away. This includes oil or grease spills. What can I do in the bathroom?  Use night lights.  Install grab bars by the toilet and in the tub and shower. Do not use  towel bars as grab bars.  Use non-skid mats or decals in the tub or shower.  If you need to sit down in the shower, use a plastic, non-slip stool.  Keep the floor dry. Clean up any water that spills on the floor as soon as it happens.  Remove soap buildup in the tub or shower regularly.  Attach bath mats securely with double-sided non-slip rug tape.  Do not have throw rugs and other things on the floor that can make you trip. What can I do in the bedroom?  Use night lights.  Make sure that you have a light by your bed that is easy to reach.  Do not use any sheets or blankets that are too big for your bed. They should not hang down onto the floor.  Have a firm chair that has side arms. You can use this for support while you get dressed.  Do not have throw rugs and other things on the floor that can  make you trip. What can I do in the kitchen?  Clean up any spills right away.  Avoid walking on wet floors.  Keep items that you use a lot in easy-to-reach places.  If you need to reach something above you, use a strong step stool that has a grab bar.  Keep electrical cords out of the way.  Do not use floor polish or wax that makes floors slippery. If you must use wax, use non-skid floor wax.  Do not have throw rugs and other things on the floor that can make you trip. What can I do with my stairs?  Do not leave any items on the stairs.  Make sure that there are handrails on both sides of the stairs and use them. Fix handrails that are broken or loose. Make sure that handrails are as long as the stairways.  Check any carpeting to make sure that it is firmly attached to the stairs. Fix any carpet that is loose or worn.  Avoid having throw rugs at the top or bottom of the stairs. If you do have throw rugs, attach them to the floor with carpet tape.  Make sure that you have a light switch at the top of the stairs and the bottom of the stairs. If you do not have them, ask  someone to add them for you. What else can I do to help prevent falls?  Wear shoes that:  Do not have high heels.  Have rubber bottoms.  Are comfortable and fit you well.  Are closed at the toe. Do not wear sandals.  If you use a stepladder:  Make sure that it is fully opened. Do not climb a closed stepladder.  Make sure that both sides of the stepladder are locked into place.  Ask someone to hold it for you, if possible.  Clearly mark and make sure that you can see:  Any grab bars or handrails.  First and last steps.  Where the edge of each step is.  Use tools that help you move around (mobility aids) if they are needed. These include:  Canes.  Walkers.  Scooters.  Crutches.  Turn on the lights when you go into a dark area. Replace any light bulbs as soon as they burn out.  Set up your furniture so you have a clear path. Avoid moving your furniture around.  If any of your floors are uneven, fix them.  If there are any pets around you, be aware of where they are.  Review your medicines with your doctor. Some medicines can make you feel dizzy. This can increase your chance of falling. Ask your doctor what other things that you can do to help prevent falls. This information is not intended to replace advice given to you by your health care provider. Make sure you discuss any questions you have with your health care provider. Document Released: 08/26/2009 Document Revised: 04/06/2016 Document Reviewed: 12/04/2014 Elsevier Interactive Patient Education  2017 [employer].

## 2020-08-17 ENCOUNTER — Telehealth: Payer: Self-pay | Admitting: Internal Medicine

## 2020-08-17 NOTE — Telephone Encounter (Signed)
Patient called in about his insurance they told him that his insurance went up because of his health issues he wanted to know what this was that is causing his insurance to go up.

## 2020-08-19 NOTE — Telephone Encounter (Signed)
No answer, no voicemail.  If Patient calling back in, okay to inform that he will need to contact his insurance as we do not determine prices or acceptable pre-existing conditions per his plan.

## 2020-08-23 DIAGNOSIS — H40023 Open angle with borderline findings, high risk, bilateral: Secondary | ICD-10-CM | POA: Diagnosis not present

## 2020-08-23 NOTE — Telephone Encounter (Signed)
Spoke with the Patient. He states the insurance company states that he has pre-existing conditions causing his life insurance policy to be less.  Informed the Patient to contact the insurance company and have them give him a list of what problem they think he has. We can then compare that to the problem list we have on file.   Patient verbalized understanding

## 2020-08-23 NOTE — Telephone Encounter (Signed)
Called Patient and he answered, stating he will need to call me back. Informed the Patient of who I was and what office I was calling from. Patient verbalized understanding and stated he would call me back.

## 2020-09-21 ENCOUNTER — Other Ambulatory Visit: Payer: Self-pay

## 2020-09-21 ENCOUNTER — Ambulatory Visit (INDEPENDENT_AMBULATORY_CARE_PROVIDER_SITE_OTHER): Payer: Medicare Other | Admitting: Internal Medicine

## 2020-09-21 ENCOUNTER — Encounter: Payer: Self-pay | Admitting: Internal Medicine

## 2020-09-21 VITALS — BP 120/80 | HR 68 | Temp 98.0°F | Ht 68.0 in | Wt 169.2 lb

## 2020-09-21 DIAGNOSIS — I2721 Secondary pulmonary arterial hypertension: Secondary | ICD-10-CM | POA: Insufficient documentation

## 2020-09-21 DIAGNOSIS — N400 Enlarged prostate without lower urinary tract symptoms: Secondary | ICD-10-CM

## 2020-09-21 DIAGNOSIS — I10 Essential (primary) hypertension: Secondary | ICD-10-CM | POA: Insufficient documentation

## 2020-09-21 DIAGNOSIS — E559 Vitamin D deficiency, unspecified: Secondary | ICD-10-CM

## 2020-09-21 DIAGNOSIS — R918 Other nonspecific abnormal finding of lung field: Secondary | ICD-10-CM

## 2020-09-21 DIAGNOSIS — M160 Bilateral primary osteoarthritis of hip: Secondary | ICD-10-CM

## 2020-09-21 DIAGNOSIS — M47819 Spondylosis without myelopathy or radiculopathy, site unspecified: Secondary | ICD-10-CM | POA: Diagnosis not present

## 2020-09-21 DIAGNOSIS — H409 Unspecified glaucoma: Secondary | ICD-10-CM

## 2020-09-21 DIAGNOSIS — J449 Chronic obstructive pulmonary disease, unspecified: Secondary | ICD-10-CM

## 2020-09-21 DIAGNOSIS — E538 Deficiency of other specified B group vitamins: Secondary | ICD-10-CM

## 2020-09-21 DIAGNOSIS — Z125 Encounter for screening for malignant neoplasm of prostate: Secondary | ICD-10-CM

## 2020-09-21 DIAGNOSIS — M546 Pain in thoracic spine: Secondary | ICD-10-CM

## 2020-09-21 DIAGNOSIS — R972 Elevated prostate specific antigen [PSA]: Secondary | ICD-10-CM

## 2020-09-21 DIAGNOSIS — G8929 Other chronic pain: Secondary | ICD-10-CM | POA: Insufficient documentation

## 2020-09-21 HISTORY — DX: Secondary pulmonary arterial hypertension: I27.21

## 2020-09-21 NOTE — Patient Instructions (Addendum)
Goal blood pressure <130 (top) / <80 (bottom)    Glaucoma  Glaucoma happens when the fluid pressure in the eyeball is too high. If the pressure stays high for too long, the eye may get damaged. This can cause a loss of vision. The most common type of glaucoma causes pressure in the eye to go up slowly. There may be no symptoms at first. Testing for this condition can help to find the condition before damage happens. Early treatment can often stop vision loss. Follow these instructions at home:  Take over-the-counter and prescription medicines only as told by your doctor. ? If you were given eye drops, use them exactly as told. You will probably need to use this medicine for the rest of your life.  Exercise often. Talk with your doctor about which types of exercise are safe for you. Do not do exercises where you lean your head on the floor while you lift part of your body off the floor (such as a headstand).  Keep all follow-up visits as told by your doctor. This is important. Contact a doctor if:  Your symptoms get worse.  You have new symptoms. Get help right away if:  You have bad pain in your eye.  You have vision problems.  You have a bad headache in the area around your eye.  You feel sick to your stomach (nauseous).  You throw up (vomit).  You start to have the same problems with your other eye. Summary  Glaucoma happens when the fluid pressure in the eyeball is too high. If this is not treated, it can cause a loss of vision.  There may be no symptoms at first. Testing can help to find the condition before damage happens.  Early treatment can often stop vision loss. This information is not intended to replace advice given to you by your health care provider. Make sure you discuss any questions you have with your health care provider. Document Revised: 10/12/2017 Document Reviewed: 06/28/2017 Elsevier Patient Education  [place] DASH  stands for "Dietary Approaches to Stop Hypertension." The DASH eating plan is a healthy eating plan that has been shown to reduce high blood pressure (hypertension). It may also reduce your risk for type 2 diabetes, heart disease, and stroke. The DASH eating plan may also help with weight loss. What are tips for following this plan?  General guidelines  Avoid eating more than 2,300 mg (milligrams) of salt (sodium) a day. If you have hypertension, you may need to reduce your sodium intake to 1,500 mg a day.  Limit alcohol intake to no more than 1 drink a day for nonpregnant women and 2 drinks a day for men. One drink equals 12 oz of beer, 5 oz of wine, or 1 oz of hard liquor.  Work with your health care provider to maintain a healthy body weight or to lose weight. Ask what an ideal weight is for you.  Get at least 30 minutes of exercise that causes your heart to beat faster (aerobic exercise) most days of the week. Activities may include walking, swimming, or biking.  Work with your health care provider or diet and nutrition specialist (dietitian) to adjust your eating plan to your individual calorie needs. Reading food labels   Check food labels for the amount of sodium per serving. Choose foods with less than 5 percent of the Daily Value of sodium. Generally, foods with less than 300 mg of sodium per serving fit  into this eating plan.  To find whole grains, look for the word "whole" as the first word in the ingredient list. Shopping  Buy products labeled as "low-sodium" or "no salt added."  Buy fresh foods. Avoid canned foods and premade or frozen meals. Cooking  Avoid adding salt when cooking. Use salt-free seasonings or herbs instead of table salt or sea salt. Check with your health care provider or pharmacist before using salt substitutes.  Do not fry foods. Cook foods using healthy methods such as baking, boiling, grilling, and broiling instead.  Cook with heart-healthy oils,  such as olive, canola, soybean, or sunflower oil. Meal planning  Eat a balanced diet that includes: ? 5 or more servings of fruits and vegetables each day. At each meal, try to fill half of your plate with fruits and vegetables. ? Up to 6-8 servings of whole grains each day. ? Less than 6 oz of lean meat, poultry, or fish each day. A 3-oz serving of meat is about the same size as a deck of cards. One egg equals 1 oz. ? 2 servings of low-fat dairy each day. ? A serving of nuts, seeds, or beans 5 times each week. ? Heart-healthy fats. Healthy fats called Omega-3 fatty acids are found in foods such as flaxseeds and coldwater fish, like sardines, salmon, and mackerel.  Limit how much you eat of the following: ? Canned or prepackaged foods. ? Food that is high in trans fat, such as fried foods. ? Food that is high in saturated fat, such as fatty meat. ? Sweets, desserts, sugary drinks, and other foods with added sugar. ? Full-fat dairy products.  Do not salt foods before eating.  Try to eat at least 2 vegetarian meals each week.  Eat more home-cooked food and less restaurant, buffet, and fast food.  When eating at a restaurant, ask that your food be prepared with less salt or no salt, if possible. What foods are recommended? The items listed may not be a complete list. Talk with your dietitian about what dietary choices are best for you. Grains Whole-grain or whole-wheat bread. Whole-grain or whole-wheat pasta. Brown rice. [place]. Bulgur. Whole-grain and low-sodium cereals. Pita bread. Low-fat, low-sodium crackers. Whole-wheat flour tortillas. Vegetables Fresh or frozen vegetables (raw, steamed, roasted, or grilled). Low-sodium or reduced-sodium tomato and vegetable juice. Low-sodium or reduced-sodium tomato sauce and tomato paste. Low-sodium or reduced-sodium canned vegetables. Fruits All fresh, dried, or frozen fruit. Canned fruit in natural juice (without added sugar). Meat  and other protein foods Skinless chicken or [place]. Ground chicken or [place]. Pork with fat trimmed off. Fish and seafood. Egg whites. Dried beans, peas, or lentils. Unsalted nuts, nut butters, and seeds. Unsalted canned beans. Lean cuts of beef with fat trimmed off. Low-sodium, lean deli meat. Dairy Low-fat (1%) or fat-free (skim) milk. Fat-free, low-fat, or reduced-fat cheeses. Nonfat, low-sodium ricotta or cottage cheese. Low-fat or nonfat yogurt. Low-fat, low-sodium cheese. Fats and oils Soft margarine without trans fats. Vegetable oil. Low-fat, reduced-fat, or light mayonnaise and salad dressings (reduced-sodium). Canola, safflower, olive, soybean, and sunflower oils. Avocado. Seasoning and other foods Herbs. Spices. Seasoning mixes without salt. Unsalted popcorn and pretzels. Fat-free sweets. What foods are not recommended? The items listed may not be a complete list. Talk with your dietitian about what dietary choices are best for you. Grains Baked goods made with fat, such as croissants, muffins, or some breads. Dry pasta or rice meal packs. Vegetables Creamed or fried vegetables. Vegetables in a cheese  sauce. Regular canned vegetables (not low-sodium or reduced-sodium). Regular canned tomato sauce and paste (not low-sodium or reduced-sodium). Regular tomato and vegetable juice (not low-sodium or reduced-sodium). Angie Fava. Olives. Fruits Canned fruit in a light or heavy syrup. Fried fruit. Fruit in cream or butter sauce. Meat and other protein foods Fatty cuts of meat. Ribs. Fried meat. Berniece Salines. Sausage. Bologna and other processed lunch meats. Salami. Fatback. Hotdogs. Bratwurst. Salted nuts and seeds. Canned beans with added salt. Canned or smoked fish. Whole eggs or egg yolks. Chicken or [place] with skin. Dairy Whole or 2% milk, cream, and half-and-half. Whole or full-fat cream cheese. Whole-fat or sweetened yogurt. Full-fat cheese. Nondairy creamers. Whipped toppings. Processed cheese and  cheese spreads. Fats and oils Butter. Stick margarine. Lard. Shortening. Ghee. Bacon fat. Tropical oils, such as coconut, palm kernel, or palm oil. Seasoning and other foods Salted popcorn and pretzels. Onion salt, garlic salt, seasoned salt, table salt, and sea salt. Worcestershire sauce. Tartar sauce. Barbecue sauce. Teriyaki sauce. Soy sauce, including reduced-sodium. Steak sauce. Canned and packaged gravies. Fish sauce. Oyster sauce. Cocktail sauce. Horseradish that you find on the shelf. Ketchup. Mustard. Meat flavorings and tenderizers. Bouillon cubes. Hot sauce and Tabasco sauce. Premade or packaged marinades. Premade or packaged taco seasonings. Relishes. Regular salad dressings. Where to find more information:  National Heart, Lung, and [place]: https://wilson-eaton.com/  American Heart Association: www.heart.org Summary  The DASH eating plan is a healthy eating plan that has been shown to reduce high blood pressure (hypertension). It may also reduce your risk for type 2 diabetes, heart disease, and stroke.  With the DASH eating plan, you should limit salt (sodium) intake to 2,300 mg a day. If you have hypertension, you may need to reduce your sodium intake to 1,500 mg a day.  When on the DASH eating plan, aim to eat more fresh fruits and vegetables, whole grains, lean proteins, low-fat dairy, and heart-healthy fats.  Work with your health care provider or diet and nutrition specialist (dietitian) to adjust your eating plan to your individual calorie needs. This information is not intended to replace advice given to you by your health care provider. Make sure you discuss any questions you have with your health care provider. Document Revised: 10/12/2017 Document Reviewed: 10/23/2016 Elsevier Patient Education  2020 [employer].
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
## 2020-10-12 ENCOUNTER — Other Ambulatory Visit: Payer: Self-pay

## 2020-10-12 ENCOUNTER — Encounter: Payer: Self-pay | Admitting: Pulmonary Disease

## 2020-10-12 ENCOUNTER — Ambulatory Visit: Payer: Medicare Other | Admitting: Pulmonary Disease

## 2020-10-12 VITALS — BP 124/74 | HR 66 | Temp 97.3°F | Ht 68.0 in | Wt 171.6 lb

## 2020-10-12 DIAGNOSIS — R0602 Shortness of breath: Secondary | ICD-10-CM

## 2020-10-12 DIAGNOSIS — J449 Chronic obstructive pulmonary disease, unspecified: Secondary | ICD-10-CM | POA: Diagnosis not present

## 2020-10-12 DIAGNOSIS — I272 Pulmonary hypertension, unspecified: Secondary | ICD-10-CM | POA: Diagnosis not present

## 2020-10-12 DIAGNOSIS — Z9189 Other specified personal risk factors, not elsewhere classified: Secondary | ICD-10-CM | POA: Diagnosis not present

## 2020-10-12 NOTE — Patient Instructions (Signed)
We are going to get breathing tests to check on your lungs.  We are going to schedule a sleeping test.  We are referring you to the lung cancer screening program.  We will see you in follow-up in 4 to 6 weeks time.  Call sooner should any new problems arise.
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
## 2020-11-04 ENCOUNTER — Telehealth: Payer: Self-pay | Admitting: Pulmonary Disease

## 2020-11-04 NOTE — Telephone Encounter (Signed)
Patient is aware of date/time of covid test prior to PFT.  

## 2020-11-08 ENCOUNTER — Other Ambulatory Visit: Payer: Self-pay

## 2020-11-08 ENCOUNTER — Other Ambulatory Visit
Admission: RE | Admit: 2020-11-08 | Discharge: 2020-11-08 | Disposition: A | Discharge status: Home / Self Care | Payer: Medicare Other | Source: Ambulatory Visit | Attending: Pulmonary Disease | Admitting: Pulmonary Disease

## 2020-11-08 DIAGNOSIS — Z01812 Encounter for preprocedural laboratory examination: Secondary | ICD-10-CM | POA: Diagnosis not present

## 2020-11-08 DIAGNOSIS — Z20822 Contact with and (suspected) exposure to covid-19: Secondary | ICD-10-CM | POA: Diagnosis not present

## 2020-11-09 ENCOUNTER — Ambulatory Visit: Payer: Medicare Other | Attending: Pulmonary Disease

## 2020-11-09 ENCOUNTER — Other Ambulatory Visit: Payer: Self-pay

## 2020-11-09 DIAGNOSIS — R0602 Shortness of breath: Secondary | ICD-10-CM | POA: Diagnosis not present

## 2020-11-09 DIAGNOSIS — J984 Other disorders of lung: Secondary | ICD-10-CM | POA: Diagnosis not present

## 2020-11-09 DIAGNOSIS — J449 Chronic obstructive pulmonary disease, unspecified: Secondary | ICD-10-CM

## 2020-11-09 LAB — SARS CORONAVIRUS 2 (TAT 6-24 HRS): SARS Coronavirus 2: NEGATIVE

## 2020-11-09 MED ORDER — ALBUTEROL SULFATE (2.5 MG/3ML) 0.083% IN NEBU
2.5000 mg | INHALATION_SOLUTION | Freq: Once | RESPIRATORY_TRACT | Status: AC
  Administered 2020-11-09: 15:00:00 2.5 mg via RESPIRATORY_TRACT
  Filled 2020-11-09: qty 3

## 2020-11-10 ENCOUNTER — Telehealth: Payer: Self-pay | Admitting: Pulmonary Disease

## 2020-11-10 NOTE — Telephone Encounter (Signed)
Lm for patient.  

## 2020-11-10 NOTE — Telephone Encounter (Signed)
I spoke with Ladona Ridgel with [facility] and she stated that she called Mr. Kuenzel on 10/18/20 and again 10/20/20 to try and get the sleep study scheduled.  He thought that the sleep study was scheduled somewhere else and that he would call Ladona Ridgel back. Ladona Ridgel spoke with Mr. Hodapp again on 10/25/20 and he was thinking that his appt for the PFT was for the sleep study.  Ladona Ridgel stated that she would call Mr. Abercrombie to get the sleep study scheduled

## 2020-11-10 NOTE — Telephone Encounter (Signed)
Called and spoke to patient.  Patient stated that he had PFT yesterday,however he thought he was going for a sleep study.  Patient is aware that Dr. Jayme Cloud ordered both PFT and split night sleep study.  He stated that he has not been contacted by [facility] to schedule sleep study. Study was ordered on 10/12/2020.  Synetta Fail, can you help with this? Thanks

## 2020-11-18 LAB — PULMONARY FUNCTION TEST ARMC ONLY
DL/VA % pred: 114 %
DL/VA: 4.67 ml/min/mmHg/L
DLCO unc % pred: 77 %
DLCO unc: 18.86 ml/min/mmHg
FEF 25-75 Post: 1.47 L/sec
FEF 25-75 Pre: 1.6 L/sec
FEF2575-%Change-Post: -8 %
FEF2575-%Pred-Post: 63 %
FEF2575-%Pred-Pre: 69 %
FEV1-%Change-Post: -3 %
FEV1-%Pred-Post: 66 %
FEV1-%Pred-Pre: 68 %
FEV1-Post: 1.76 L
FEV1-Pre: 1.83 L
FEV1FVC-%Change-Post: -1 %
FEV1FVC-%Pred-Pre: 101 %
FEV6-%Change-Post: -2 %
FEV6-%Pred-Post: 68 %
FEV6-%Pred-Pre: 70 %
FEV6-Post: 2.32 L
FEV6-Pre: 2.36 L
FEV6FVC-%Pred-Post: 105 %
FEV6FVC-%Pred-Pre: 105 %
FVC-%Change-Post: -2 %
FVC-%Pred-Post: 65 %
FVC-%Pred-Pre: 66 %
FVC-Post: 2.32 L
Post FEV1/FVC ratio: 76 %
Post FEV6/FVC ratio: 100 %
Pre FEV1/FVC ratio: 77 %
Pre FEV6/FVC Ratio: 100 %
RV % pred: 97 %
RV: 2.27 L
TLC % pred: 67 %
TLC: 4.5 L

## 2020-11-23 ENCOUNTER — Encounter: Payer: Self-pay | Admitting: Pulmonary Disease

## 2020-11-23 ENCOUNTER — Ambulatory Visit (INDEPENDENT_AMBULATORY_CARE_PROVIDER_SITE_OTHER): Payer: Medicare Other

## 2020-11-23 ENCOUNTER — Other Ambulatory Visit: Payer: Medicare Other

## 2020-11-23 ENCOUNTER — Other Ambulatory Visit: Payer: Self-pay

## 2020-11-23 ENCOUNTER — Ambulatory Visit: Payer: Medicare Other | Admitting: Pulmonary Disease

## 2020-11-23 VITALS — BP 128/72 | HR 62 | Temp 97.3°F | Ht 68.0 in | Wt 169.0 lb

## 2020-11-23 DIAGNOSIS — F17210 Nicotine dependence, cigarettes, uncomplicated: Secondary | ICD-10-CM | POA: Diagnosis not present

## 2020-11-23 DIAGNOSIS — E559 Vitamin D deficiency, unspecified: Secondary | ICD-10-CM

## 2020-11-23 DIAGNOSIS — N400 Enlarged prostate without lower urinary tract symptoms: Secondary | ICD-10-CM | POA: Diagnosis not present

## 2020-11-23 DIAGNOSIS — R972 Elevated prostate specific antigen [PSA]: Secondary | ICD-10-CM

## 2020-11-23 DIAGNOSIS — Z125 Encounter for screening for malignant neoplasm of prostate: Secondary | ICD-10-CM

## 2020-11-23 DIAGNOSIS — J8410 Pulmonary fibrosis, unspecified: Secondary | ICD-10-CM

## 2020-11-23 DIAGNOSIS — G4730 Sleep apnea, unspecified: Secondary | ICD-10-CM

## 2020-11-23 DIAGNOSIS — J439 Emphysema, unspecified: Secondary | ICD-10-CM | POA: Diagnosis not present

## 2020-11-23 DIAGNOSIS — Z9189 Other specified personal risk factors, not elsewhere classified: Secondary | ICD-10-CM

## 2020-11-23 DIAGNOSIS — I2721 Secondary pulmonary arterial hypertension: Secondary | ICD-10-CM

## 2020-11-23 DIAGNOSIS — I272 Pulmonary hypertension, unspecified: Secondary | ICD-10-CM

## 2020-11-23 DIAGNOSIS — J449 Chronic obstructive pulmonary disease, unspecified: Secondary | ICD-10-CM

## 2020-11-23 DIAGNOSIS — I10 Essential (primary) hypertension: Secondary | ICD-10-CM | POA: Diagnosis not present

## 2020-11-23 DIAGNOSIS — E538 Deficiency of other specified B group vitamins: Secondary | ICD-10-CM | POA: Diagnosis not present

## 2020-11-23 LAB — COMPREHENSIVE METABOLIC PANEL
ALT: 7 U/L (ref 0–53)
AST: 13 U/L (ref 0–37)
Albumin: 3.8 g/dL (ref 3.5–5.2)
Alkaline Phosphatase: 58 U/L (ref 39–117)
BUN: 13 mg/dL (ref 6–23)
CO2: 31 mEq/L (ref 19–32)
Calcium: 8.7 mg/dL (ref 8.4–10.5)
Chloride: 108 mEq/L (ref 96–112)
Creatinine, Ser: 0.77 mg/dL (ref 0.40–1.50)
GFR: 90.4 mL/min (ref >60.00)
Glucose, Bld: 106 mg/dL — ABNORMAL HIGH (ref 70–99)
Potassium: 4.4 mEq/L (ref 3.5–5.1)
Sodium: 141 mEq/L (ref 135–145)
Total Bilirubin: 0.5 mg/dL (ref 0.2–1.2)
Total Protein: 6.4 g/dL (ref 6.0–8.3)

## 2020-11-23 LAB — CBC WITH DIFFERENTIAL/PLATELET
Basophils Absolute: 0 10*3/uL (ref 0.0–0.1)
Basophils Relative: 0.3 % (ref 0.0–3.0)
Eosinophils Absolute: 0.1 10*3/uL (ref 0.0–0.7)
Eosinophils Relative: 1.4 % (ref 0.0–5.0)
HCT: 37.6 % — ABNORMAL LOW (ref 39.0–52.0)
Hemoglobin: 12.9 g/dL — ABNORMAL LOW (ref 13.0–17.0)
Lymphocytes Relative: 29.3 % (ref 12.0–46.0)
Lymphs Abs: 1.3 10*3/uL (ref 0.7–4.0)
MCHC: 34.3 g/dL (ref 30.0–36.0)
MCV: 96.5 fl (ref 78.0–100.0)
Monocytes Absolute: 0.6 10*3/uL (ref 0.1–1.0)
Monocytes Relative: 13.3 % — ABNORMAL HIGH (ref 3.0–12.0)
Neutro Abs: 2.6 10*3/uL (ref 1.4–7.7)
Neutrophils Relative %: 55.7 % (ref 43.0–77.0)
Platelets: 135 10*3/uL — ABNORMAL LOW (ref 150.0–400.0)
RBC: 3.9 Mil/uL — ABNORMAL LOW (ref 4.22–5.81)
RDW: 14.3 % (ref 11.5–15.5)
WBC: 4.6 10*3/uL (ref 4.0–10.5)

## 2020-11-23 LAB — LIPID PANEL
Cholesterol: 208 mg/dL — ABNORMAL HIGH (ref 0–200)
HDL: 63.4 mg/dL (ref >39.00)
LDL Cholesterol: 134 mg/dL — ABNORMAL HIGH (ref 0–99)
NonHDL: 144.11
Total CHOL/HDL Ratio: 3
Triglycerides: 52 mg/dL (ref 0.0–149.0)
VLDL: 10.4 mg/dL (ref 0.0–40.0)

## 2020-11-23 LAB — PSA, MEDICARE: PSA: 2.84 ng/ml (ref 0.10–4.00)

## 2020-11-23 LAB — VITAMIN B12: Vitamin B-12: 372 pg/mL (ref 211–911)

## 2020-11-23 LAB — VITAMIN D 25 HYDROXY (VIT D DEFICIENCY, FRACTURES): VITD: 33.46 ng/mL (ref 30.00–100.00)

## 2020-11-23 NOTE — Telephone Encounter (Signed)
Juan [name] was seen in the office today and he still hadn't heard anything from [facility].  I called [name] with [facility] and we now have the patient scheduled to do sleep study on 12/09/20 @ 8:45pm. [name] will be mailing him instructions to follow the night of the sleep study.

## 2020-11-23 NOTE — Progress Notes (Signed)
Patient is here for a BP check due to bp being high at last visit, as per patient.  Currently patients BP is 130/73 and BPM is 65.  Patient has no complaints of headaches, blurry vision, chest pain, arm pain, light headedness, dizziness, and nor jaw pain. Please see previous note on 09/21/2020 by Dr. Olivia Mackie McLean-Scocuzza  for order.
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
## 2020-11-23 NOTE — Patient Instructions (Signed)
We are going to get a sleep study  No need for inhalers at this point  You do need to quit smoking  We will see you in follow-up in 6 to 8 weeks we will call you with the results of your sleep study

## 2020-11-23 NOTE — Addendum Note (Signed)
Addended by: Tor Netters I on: 11/23/2020 10:20 AM   Modules accepted: Orders

## 2020-11-24 LAB — URINALYSIS, ROUTINE W REFLEX MICROSCOPIC
Bilirubin, UA: NEGATIVE
Glucose, UA: NEGATIVE
Ketones, UA: NEGATIVE
Leukocytes,UA: NEGATIVE
Nitrite, UA: NEGATIVE
RBC, UA: NEGATIVE
Specific Gravity, UA: 1.024 (ref 1.005–1.030)
Urobilinogen, Ur: 1 mg/dL (ref 0.2–1.0)
pH, UA: 5.5 (ref 5.0–7.5)

## 2020-12-09 ENCOUNTER — Telehealth: Payer: Self-pay | Admitting: Internal Medicine

## 2020-12-09 ENCOUNTER — Ambulatory Visit: Payer: Medicare Other | Admitting: Internal Medicine

## 2020-12-09 NOTE — Telephone Encounter (Signed)
Patient is coming by to pick up letter stating that he has a appointment on 12-16-20 this is what he stated he need for his job to show them

## 2020-12-09 NOTE — Telephone Encounter (Signed)
Printed and placed upfront under M for [place].

## 2020-12-16 ENCOUNTER — Ambulatory Visit (INDEPENDENT_AMBULATORY_CARE_PROVIDER_SITE_OTHER): Payer: Medicare Other | Admitting: Internal Medicine

## 2020-12-16 ENCOUNTER — Telehealth: Payer: Self-pay | Admitting: Internal Medicine

## 2020-12-16 ENCOUNTER — Encounter: Payer: Self-pay | Admitting: Internal Medicine

## 2020-12-16 ENCOUNTER — Other Ambulatory Visit: Payer: Self-pay

## 2020-12-16 VITALS — BP 124/80 | HR 71 | Temp 98.1°F | Ht 68.0 in | Wt 171.8 lb

## 2020-12-16 DIAGNOSIS — R937 Abnormal findings on diagnostic imaging of other parts of musculoskeletal system: Secondary | ICD-10-CM | POA: Diagnosis not present

## 2020-12-16 DIAGNOSIS — M5416 Radiculopathy, lumbar region: Secondary | ICD-10-CM

## 2020-12-16 DIAGNOSIS — M545 Low back pain, unspecified: Secondary | ICD-10-CM | POA: Diagnosis not present

## 2020-12-16 DIAGNOSIS — M160 Bilateral primary osteoarthritis of hip: Secondary | ICD-10-CM | POA: Diagnosis not present

## 2020-12-16 MED ORDER — MELOXICAM 7.5 MG PO TABS: 7.5000 mg | ORAL_TABLET | Freq: Two times a day (BID) | ORAL | 11 refills | Status: DC | PRN

## 2020-12-16 NOTE — Telephone Encounter (Signed)
Vitamin D3 can take 2000 IU daily otc
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
## 2020-12-16 NOTE — Patient Instructions (Signed)
Consider booster covid vaccine end of 12/2020 [employer]  Arthritis Arthritis is a term that is commonly used to refer to joint pain or joint disease. There are more than 100 types of arthritis. What are the causes? The most common cause of this condition is wear and tear of a joint. Other causes include:  Gout.  Inflammation of a joint.  An infection of a joint.  Sprains and other injuries near the joint.  A reaction to medicines or drugs, or an allergic reaction. In some cases, the cause may not be known. What are the signs or symptoms? The main symptom of this condition is pain in the joint during movement. Other symptoms include:  Redness, swelling, or stiffness at a joint.  Warmth coming from the joint.  Fever.  Overall feeling of illness. How is this diagnosed? This condition may be diagnosed with a physical exam and tests, including:  Blood tests.  Urine tests.  Imaging tests, such as X-rays, an MRI, or a CT scan. Sometimes, fluid is removed from a joint for testing. How is this treated? This condition may be treated with:  Treatment of the cause, if it is known.  Rest.  Raising (elevating) the joint.  Applying cold or hot packs to the joint.  Medicines to improve symptoms and reduce inflammation.  Injections of a steroid such as cortisone into the joint to help reduce pain and inflammation. Depending on the cause of your arthritis, you may need to make lifestyle changes to reduce stress on your joint. Changes may include:  Exercising more.  Losing weight. Follow these instructions at home: Medicines  Take over-the-counter and prescription medicines only as told by your health care provider.  Do not take aspirin to relieve pain if your health care provider thinks that gout may be causing your pain. Activity  Rest your joint if told by your health care provider. Rest is important when your disease is active and your joint feels painful, swollen, or  stiff.  Avoid activities that make the pain worse. It is important to balance activity with rest.  Exercise your joint regularly with range-of-motion exercises as told by your health care provider. Try doing low-impact exercise, such as: ? Swimming. ? Water aerobics. ? Biking. ? Walking. Managing pain, stiffness, and swelling  If directed, put ice on the joint. ? Put ice in a plastic bag. ? Place a towel between your skin and the bag. ? Leave the ice on for 20 minutes, 2-3 times per day.  If your joint is swollen, raise (elevate) it above the level of your heart if directed by your health care provider.  If your joint feels stiff in the morning, try taking a warm shower.  If directed, apply heat to the affected area as often as told by your health care provider. Use the heat source that your health care provider recommends, such as a moist heat pack or a heating pad. If you have diabetes, do not apply heat without permission from your health care provider. To apply heat: ? Place a towel between your skin and the heat source. ? Leave the heat on for 20-30 minutes. ? Remove the heat if your skin turns bright red. This is especially important if you are unable to feel pain, heat, or cold. You may have a greater risk of getting burned.      General instructions  Do not use any products that contain nicotine or tobacco, such as cigarettes, e-cigarettes, and chewing tobacco. If you  need help quitting, ask your health care provider.  Keep all follow-up visits as told by your health care provider. This is important. Contact a health care provider if:  The pain gets worse.  You have a fever. Get help right away if:  You develop severe joint pain, swelling, or redness.  Many joints become painful and swollen.  You develop severe back pain.  You develop severe weakness in your leg.  You cannot control your bladder or bowels. Summary  Arthritis is a term that is commonly used to  refer to joint pain or joint disease. There are more than 100 types of arthritis.  The most common cause of this condition is wear and tear of a joint. Other causes include gout, inflammation or infection of the joint, sprains, or allergies.  Symptoms of this condition include redness, swelling, or stiffness of the joint. Other symptoms include warmth, fever, or feeling ill.  This condition is treated with rest, elevation, medicines, and applying cold or hot packs.  Follow your health care provider's instructions about medicines, activity, exercises, and other home care treatments. This information is not intended to replace advice given to you by your health care provider. Make sure you discuss any questions you have with your health care provider. Document Revised: 10/07/2018 Document Reviewed: 10/07/2018 Elsevier Patient Education  2021 [place].  Pulmonary Nodule  A pulmonary nodule is a small, round growth of tissue in the lung. A nodule may be cancer, but most nodules are not cancer. What are the causes?  Infection from a germ (bacteria, fungus, or virus), such as tuberculosis.  Tissue that is cancer, such as: ? Cancer in the lung. ? Cancer that has spread to the lung from another part of the body.  A growth of tissue (mass) that is not cancer.  Swelling and irritation from conditions such as rheumatoid arthritis.  Having blood vessels that are not normal in the lungs. What are the signs or symptoms? Many times, there are no symptoms. If you get symptoms, they normally have another cause, such as infection. How is this treated? Treatment depends on:  If your nodule is cancer or if it is not cancer.  What your risk of getting cancer is. Some nodules are not cancer. If this is the case for you, you may not need treatment. Your doctor may do tests to watch the nodule for changes. If the nodule is cancer:  You will need tests, such as CT and PET scans.  You may need  treatment. This may include: ? Surgery. ? Treatment with high-energy X-rays (radiation therapy). ? Medicines. Some nodules need to be taken out. You may have a procedure to have the nodule taken out. During the procedure, your doctor will make a cut (incision) into your chest and take out the part of your lung that has the nodule. Follow these instructions at home:  Take over-the-counter and prescription medicines only as told by your doctor.  Do not smoke or use any products that contain nicotine or tobacco. If you need help quitting, ask your doctor.  Keep all follow-up visits. Contact a doctor if:  You have pain in your chest, back, or shoulder.  You are short of breath or have trouble breathing when you are active.  You get a cough.  Your voice starts to sound raspy, breathy, or strained (hoarse), and you do not know why.  You feel sick or more tired than normal.  You do not feel like eating.  You lose weight without trying.  You get chills, or you start to sweat a lot during sleep.  You need two or more pillows to sleep on at night.  You have: ? A fever and your symptoms get worse all of a sudden. ? A fever or symptoms for more than 2-3 days. Get help right away if:  You cannot catch your breath.  You have sudden chest pain.  You start making high-pitched whistling sounds when you breathe, most often when you breathe out (you wheeze).  You cannot stop coughing.  You cough up blood or bloody mucus from your lungs (sputum).  You get dizzy or feel like you may faint. These symptoms may represent a serious problem that is an emergency. Do not wait to see if the symptoms will go away. Get medical help right away. Call your local emergency services (911 in the [place] Do not drive yourself to the hospital. Summary  A pulmonary nodule is a small, round growth of tissue in the lung. Most of these nodules are not cancer.  Common causes of nodules in the lung include  infection, swelling and irritation, and growths that are not cancer.  Treatment depends on whether the nodule is cancer or is not cancer. Treatment also depends on your risk of getting cancer.  If the nodule is cancer, you will need certain tests and treatments as told by your doctor. This information is not intended to replace advice given to you by your health care provider. Make sure you discuss any questions you have with your health care provider. Document Revised: 05/19/2020 Document Reviewed: 05/19/2020 Elsevier Patient Education  2021 [place].  Atherosclerosis  Atherosclerosis is narrowing and hardening of the arteries. Arteries are blood vessels that carry blood from the heart to all parts of the body. This blood contains oxygen. Arteries can become narrow or blocked from inflammation or from a buildup of fat, cholesterol, calcium, and other substances (plaque). Plaque decreases the amount of blood that can flow through the artery. Atherosclerosis can affect any artery in your body, including:  Heart arteries. Damage to these arteries may lead to coronary artery disease, which can cause a heart attack.  Brain arteries. Damage to these arteries may cause a stroke.  Leg, arm, and pelvis arteries. Peripheral artery disease (PAD) may result from damage to these arteries.  Kidney arteries. Kidney (renal) failure may result from damage to kidney arteries. Treatment may slow the disease and prevent further damage to your heart, brain, peripheral arteries, and kidneys. What are the causes? This condition develops slowly over many years. The inner layers of your arteries become damaged and allow the gradual buildup of plaque. The exact cause of atherosclerosis is not fully understood. Symptoms of atherosclerosis do not occur until an artery becomes narrow or blocked. What increases the risk? The following factors may make you more likely to develop this condition:  Being middle-aged  or older.  Certain medical conditions, including: ? High blood pressure. ? High cholesterol. ? High blood fats (triglycerides). ? Diabetes. ? Sleep apnea.  A family history of atherosclerosis.  Being overweight.  Using products that contain tobacco or nicotine.  Not exercising enough (sedentary lifestyle).  Having a substance in your blood called C-reactive protein (CRP). This is a sign of increased levels of inflammation in your body.  Being stressed.  Drinking too much alcohol or using drugs, such as cocaine or methamphetamine. What are the signs or symptoms? Symptoms of atherosclerosis may not be obvious  until there is damage to an area of your body that is not getting enough blood. Sometimes, atherosclerosis does not cause symptoms. Symptoms of this condition include:  Coronary artery disease. This may cause chest pain and shortness of breath.  Decreased blood supply to your brain, which may cause a stroke. Signs of a stroke may include sudden: ? Weakness or numbness in your face, arm, or leg, especially on one side of your body. ? Trouble walking or difficulty moving your arms or legs. ? Loss of balance or coordination. ? Confusion. ? Slurred speech (dysarthria). ? Trouble speaking, or trouble understanding speech, or both (aphasia). ? Vision changes in one or both eyes. This may be double vision, blurred vision, or loss of vision. ? Severe headache with no known cause. The headache is often described as the worst headache ever experienced.  PAD, which may cause pain and numbness, often in your legs and hips.  Renal failure. This may cause tiredness, problems with urination, swelling, and itchy skin. How is this diagnosed? This condition is diagnosed based on your medical history and a physical exam. During the exam, your health care provider will:  Check your pulse in different places.  Listen for a "whooshing" sound over your arteries (bruit). You may also have  tests, such as:  Blood tests to check your levels of cholesterol, triglycerides, and CRP.  Electrocardiogram (ECG) to check for heart damage.  Chest X-ray to see if you have an enlarged heart, which is a sign of heart failure.  Stress test to see how your heart reacts to exercise.  Echocardiogram to get images of the inside of your heart.  Ankle-brachial index to compare blood pressure in your arms to blood pressure in your ankles.  Ultrasound of your peripheral arteries to check blood flow.  CT scan to check for damage to your heart or brain.  X-rays of blood vessels after dye has been injected (angiogram) to check blood flow. How is this treated? This condition is treated with lifestyle changes as the first step. These may include:  Changing your diet.  Losing weight.  Reducing stress.  Exercising and being physically active more regularly.  Quitting smoking. You may also need medicine to:  Lower triglycerides and cholesterol.  Control blood pressure.  Prevent blood clots.  Lower inflammation in your body.  Control your blood sugar. Sometimes, surgery is needed to:  Remove plaque from an artery (endarterectomy).  Open or widen a narrowed heart artery (angioplasty).  Create a new path for your blood with one of these procedures: ? Heart (coronary) artery bypass graft surgery. ? Peripheral artery bypass graft surgery. Follow these instructions at home: Eating and drinking  Eat a heart-healthy diet. Talk with your health care provider or a diet and nutrition specialist (dietitian) if you need help. A heart-healthy diet involves: ? Limiting unhealthy fats and increasing healthy fats. Some examples of healthy fats are olive oil and canola oil. ? Eating plant-based foods, such as fruits, vegetables, nuts, whole grains, and legumes (such as peas and lentils).  If you drink alcohol: ? Limit how much you use to:  0-1 drink a day for nonpregnant women.  0-2  drinks a day for men. ? Be aware of how much alcohol is in your drink. In the [place] one drink equals one 12 oz bottle of beer (355 mL), one 5 oz glass of wine (148 mL), or one 1 oz glass of hard liquor (44 mL).   Lifestyle  Maintain a  healthy weight. Lose weight if your health care provider says that you need to do that.  Follow an exercise program as told by your health care provider.  Do not use any products that contain nicotine or tobacco, such as cigarettes, e-cigarettes, and chewing tobacco. If you need help quitting, ask your health care provider.  Do not use drugs.   General instructions  Take over-the-counter and prescription medicines only as told by your health care provider.  Manage other health conditions as told.  Keep all follow-up visits as told by your health care provider. This is important. Contact a health care provider if you have:  An irregular heartbeat.  Unexplained fatigue.  Trouble urinating, or you are producing less urine or foamy urine.  Swelling of your hands or feet, or itchy skin.  Unexplained pain or numbness in your legs or hips. Get help right away if:  You have any symptoms of a heart attack. These may be: ? Chest pain. This includes squeezing chest pain that may feel like indigestion (angina). ? Shortness of breath. ? Pain in your neck, jaw, arms, back, or stomach. ? Cold sweat. ? Nausea. ? Light-headedness.  You have any symptoms of a stroke. "BE FAST" is an easy way to remember the main warning signs of a stroke: ? B - Balance. Signs are dizziness, sudden trouble walking, or loss of balance. ? E - Eyes. Signs are trouble seeing or a sudden change in vision. ? F - Face. Signs are sudden weakness or numbness of the face, or the face or eyelid drooping on one side. ? A - Arms. Signs are weakness or numbness in an arm. This happens suddenly and usually on one side of the body. ? S - Speech. Signs are sudden trouble speaking, slurred  speech, or trouble understanding what people say. ? T - Time. Time to call emergency services. Write down what time symptoms started.  You have other signs of a stroke, such as: ? A sudden, severe headache with no known cause. ? Nausea or vomiting. ? Seizure. These symptoms may represent a serious problem that is an emergency. Do not wait to see if the symptoms will go away. Get medical help right away. Call your local emergency services (911 in the [place] Do not drive yourself to the hospital. Summary  Atherosclerosis is narrowing and hardening of the arteries.  Arteries can become narrow from inflammation or from a buildup of fat, cholesterol, calcium, and other substances (plaque).  This condition may not cause any symptoms. If you do have symptoms, they are caused by damage to an area of your body that is not getting enough blood.  Treatment starts with lifestyle changes and may include medicines. In some cases, surgery is needed.  Get help right away if you have any symptoms of a heart attack or stroke. This information is not intended to replace advice given to you by your health care provider. Make sure you discuss any questions you have with your health care provider. Document Revised: 09/08/2019 Document Reviewed: 09/08/2019 Elsevier Patient Education  2021 [place].  Chronic Obstructive Pulmonary Disease  Chronic obstructive pulmonary disease (COPD) is a long-term (chronic) condition that affects the lungs. COPD is a general term that can be used to describe many different lung problems that cause lung swelling (inflammation) and limit airflow, including chronic bronchitis and emphysema. If you have COPD, your lung function will probably never return to normal. In most cases, it gets worse over time. However,  there are steps you can take to slow the progression of the disease and improve your quality of life. What are the causes? This condition may be caused by:  Smoking.  This is the most common cause.  Certain genes passed down through families. What increases the risk? The following factors may make you more likely to develop this condition:  Secondhand smoke from cigarettes, pipes, or cigars.  Exposure to chemicals and other irritants such as fumes and dust in the work environment.  Chronic lung conditions or infections. What are the signs or symptoms? Symptoms of this condition include:  Shortness of breath, especially during physical activity.  Chronic cough with a large amount of thick mucus. Sometimes the cough may not have any mucus (dry cough).  Wheezing.  Rapid breaths.  Gray or bluish discoloration (cyanosis) of the skin, especially in your fingers, toes, or lips.  Feeling tired (fatigue).  Weight loss.  Chest tightness.  Frequent infections.  Episodes when breathing symptoms become much worse (exacerbations).  Swelling in the ankles, feet, or legs. This may occur in later stages of the disease. How is this diagnosed? This condition is diagnosed based on:  Your medical history.  A physical exam. You may also have tests, including:  Lung (pulmonary) function tests. This may include a spirometry test, which measures your ability to exhale properly.  Chest X-ray.  CT scan.  Blood tests. How is this treated? This condition may be treated with:  Medicines. These may include inhaled rescue medicines to treat acute exacerbations as well as long-term, or maintenance, medicines to prevent flare-ups of COPD. ? Bronchodilators help treat COPD by dilating the airways to allow increased airflow and make your breathing more comfortable. ? Steroids can reduce airway inflammation and help prevent exacerbations.  Smoking cessation. If you smoke, your health care provider may ask you to quit, and may also recommend therapy or replacement products to help you quit.  Pulmonary rehabilitation. This may involve working with a team of  health care providers and specialists, such as respiratory, occupational, and physical therapists.  Exercise and physical activity. These are beneficial for nearly all people with COPD.  Nutrition therapy to gain weight, if you are underweight.  Oxygen. Supplemental oxygen therapy is only helpful if you have a low oxygen level in your blood (hypoxemia).  Lung surgery or transplant.  Palliative care. This is to help people with COPD feel comfortable when treatment is no longer working. Follow these instructions at home: Medicines  Take over-the-counter and prescription medicines (inhaled or pills) only as told by your health care provider.  Talk to your health care provider before taking any cough or allergy medicines. You may need to avoid certain medicines that dry out your airways. Lifestyle  If you are a smoker, the most important thing that you can do is to stop smoking. Do not use any products that contain nicotine or tobacco, such as cigarettes and e-cigarettes. If you need help quitting, ask your health care provider. Continuing to smoke will cause the disease to progress faster.  Avoid exposure to things that irritate your lungs, such as smoke, chemicals, and fumes.  Stay active, but balance activity with periods of rest. Exercise and physical activity will help you maintain your ability to do things you want to do.  Learn and use relaxation techniques to manage stress and to control your breathing.  Get the right amount of sleep and get quality sleep. Most adults need 7 or more hours per night.  Eat healthy foods. Eating smaller, more frequent meals and resting before meals may help you maintain your strength. Controlled breathing Learn and use controlled breathing techniques as directed by your health care provider. Controlled breathing techniques include:  Pursed lip breathing. Start by breathing in (inhaling) through your nose for 1 second. Then, purse your lips as if you  were going to whistle and breathe out (exhale) through the pursed lips for 2 seconds.  Diaphragmatic breathing. Start by putting one hand on your abdomen just above your waist. Inhale slowly through your nose. The hand on your abdomen should move out. Then purse your lips and exhale slowly. You should be able to feel the hand on your abdomen moving in as you exhale. Controlled coughing Learn and use controlled coughing to clear mucus from your lungs. Controlled coughing is a series of short, progressive coughs. The steps of controlled coughing are: 1. Lean your head slightly forward. 2. Breathe in deeply using diaphragmatic breathing. 3. Try to hold your breath for 3 seconds. 4. Keep your mouth slightly open while coughing twice. 5. Spit any mucus out into a tissue. 6. Rest and repeat the steps once or twice as needed. General instructions  Make sure you receive all the vaccines that your health care provider recommends, especially the pneumococcal and influenza vaccines. Preventing infection and hospitalization is very important when you have COPD.  Use oxygen therapy and pulmonary rehabilitation if directed to by your health care provider. If you require home oxygen therapy, ask your health care provider whether you should purchase a pulse oximeter to measure your oxygen level at home.  Work with your health care provider to develop a COPD action plan. This will help you know what steps to take if your condition gets worse.  Keep other chronic health conditions under control as told by your health care provider.  Avoid extreme temperature and humidity changes.  Avoid contact with people who have an illness that spreads from person to person (is contagious), such as viral infections or pneumonia.  Keep all follow-up visits as told by your health care provider. This is important. Contact a health care provider if:  You are coughing up more mucus than usual.  There is a change in the  color or thickness of your mucus.  Your breathing is more labored than usual.  Your breathing is faster than usual.  You have difficulty sleeping.  You need to use your rescue medicines or inhalers more often than expected.  You have trouble doing routine activities such as getting dressed or walking around the house. Get help right away if:  You have shortness of breath while you are resting.  You have shortness of breath that prevents you from: ? Being able to talk. ? Performing your usual physical activities.  You have chest pain lasting longer than 5 minutes.  Your skin color is more blue (cyanotic) than usual.  You measure low oxygen saturations for longer than 5 minutes with a pulse oximeter.  You have a fever.  You feel too tired to breathe normally. Summary  Chronic obstructive pulmonary disease (COPD) is a long-term (chronic) condition that affects the lungs.  Your lung function will probably never return to normal. In most cases, it gets worse over time. However, there are steps you can take to slow the progression of the disease and improve your quality of life.  Treatment for COPD may include taking medicines, quitting smoking, pulmonary rehabilitation, and changes to diet and exercise. As  the disease progresses, you may need oxygen therapy, a lung transplant, or palliative care.  To help manage your condition, do not smoke, avoid exposure to things that irritate your lungs, stay up to date on all vaccines, and follow your health care provider's instructions for taking medicines. This information is not intended to replace advice given to you by your health care provider. Make sure you discuss any questions you have with your health care provider. Document Revised: 10/12/2017 Document Reviewed: 12/04/2016 Elsevier Patient Education  2021 [employer].

## 2020-12-17 NOTE — Telephone Encounter (Signed)
Patient informed and verbalized understanding

## 2020-12-22 ENCOUNTER — Encounter: Payer: Self-pay | Admitting: Pulmonary Disease

## 2020-12-22 ENCOUNTER — Ambulatory Visit: Payer: Medicare Other | Attending: Pulmonary Disease

## 2020-12-22 DIAGNOSIS — G4733 Obstructive sleep apnea (adult) (pediatric): Secondary | ICD-10-CM | POA: Insufficient documentation

## 2020-12-22 DIAGNOSIS — Z9189 Other specified personal risk factors, not elsewhere classified: Secondary | ICD-10-CM | POA: Diagnosis not present

## 2020-12-22 DIAGNOSIS — J439 Emphysema, unspecified: Secondary | ICD-10-CM | POA: Diagnosis not present

## 2020-12-23 ENCOUNTER — Other Ambulatory Visit: Payer: Self-pay

## 2020-12-24 ENCOUNTER — Telehealth (INDEPENDENT_AMBULATORY_CARE_PROVIDER_SITE_OTHER): Payer: Medicare Other | Admitting: Pulmonary Disease

## 2020-12-24 DIAGNOSIS — G4733 Obstructive sleep apnea (adult) (pediatric): Secondary | ICD-10-CM | POA: Diagnosis not present

## 2020-12-24 NOTE — Telephone Encounter (Signed)
NPSG  showed mild  OSA with AHI 10/ hr >> corrected by CPAP 9 cm He slept better with CPAP Suggest CPAP 9 cm with med full face mask

## 2020-12-27 NOTE — Telephone Encounter (Signed)
Will need CPAP at 9 cm H2O with full facemask.

## 2020-12-27 NOTE — Addendum Note (Signed)
Addended by: Claudette Head A on: 12/27/2020 11:23 AM   Modules accepted: Orders

## 2020-12-27 NOTE — Telephone Encounter (Signed)
Patient is aware of results and voiced his understanding.  Order has been place for cpap as patient is agreeable with therapy. Patient will call to schedule rov after cpap setup. Nothing further needed.

## 2020-12-28 ENCOUNTER — Ambulatory Visit: Payer: Medicare Other

## 2021-01-03 ENCOUNTER — Other Ambulatory Visit: Payer: Self-pay

## 2021-01-03 ENCOUNTER — Ambulatory Visit
Admission: RE | Admit: 2021-01-03 | Discharge: 2021-01-03 | Disposition: A | Discharge status: Home / Self Care | Payer: Medicare Other | Source: Ambulatory Visit | Attending: Internal Medicine | Admitting: Internal Medicine

## 2021-01-03 DIAGNOSIS — M4186 Other forms of scoliosis, lumbar region: Secondary | ICD-10-CM | POA: Diagnosis not present

## 2021-01-03 DIAGNOSIS — M545 Low back pain, unspecified: Secondary | ICD-10-CM | POA: Diagnosis not present

## 2021-01-03 DIAGNOSIS — M5416 Radiculopathy, lumbar region: Secondary | ICD-10-CM | POA: Diagnosis not present

## 2021-01-03 DIAGNOSIS — M48061 Spinal stenosis, lumbar region without neurogenic claudication: Secondary | ICD-10-CM | POA: Diagnosis not present

## 2021-01-03 DIAGNOSIS — M5116 Intervertebral disc disorders with radiculopathy, lumbar region: Secondary | ICD-10-CM | POA: Diagnosis not present

## 2021-01-03 DIAGNOSIS — M4726 Other spondylosis with radiculopathy, lumbar region: Secondary | ICD-10-CM | POA: Diagnosis not present

## 2021-01-07 ENCOUNTER — Encounter: Payer: Self-pay | Admitting: Pulmonary Disease

## 2021-01-10 ENCOUNTER — Telehealth: Payer: Self-pay | Admitting: Internal Medicine

## 2021-01-10 ENCOUNTER — Ambulatory Visit: Payer: Medicare Other | Admitting: Pulmonary Disease

## 2021-01-10 ENCOUNTER — Encounter: Payer: Self-pay | Admitting: Pulmonary Disease

## 2021-01-10 ENCOUNTER — Other Ambulatory Visit: Payer: Self-pay

## 2021-01-10 VITALS — BP 122/78 | HR 65 | Temp 98.0°F | Ht 68.0 in | Wt 172.8 lb

## 2021-01-10 DIAGNOSIS — F17210 Nicotine dependence, cigarettes, uncomplicated: Secondary | ICD-10-CM

## 2021-01-10 DIAGNOSIS — G4733 Obstructive sleep apnea (adult) (pediatric): Secondary | ICD-10-CM | POA: Diagnosis not present

## 2021-01-10 DIAGNOSIS — I2721 Secondary pulmonary arterial hypertension: Secondary | ICD-10-CM | POA: Diagnosis not present

## 2021-01-10 NOTE — Addendum Note (Signed)
Addended by: Orland Mustard on: 01/10/2021 10:24 AM   Modules accepted: Orders

## 2021-01-10 NOTE — Patient Instructions (Signed)
We are going to set you up with our sleep physicians.  To see you in follow-up after you get your machine for sleep started.

## 2021-01-10 NOTE — Telephone Encounter (Signed)
I still have not heard from his attorney about what forms are needed for completion by PCP?   Can you call again please attorney Loretha Stapler in [place] called today [phone]
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
## 2021-01-12 ENCOUNTER — Encounter: Payer: Self-pay | Admitting: Pulmonary Disease
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
## 2021-01-14 ENCOUNTER — Encounter: Payer: Self-pay | Admitting: Internal Medicine

## 2021-01-14 NOTE — Telephone Encounter (Signed)
Get address and mail records please to his lawyer with letters

## 2021-01-17 NOTE — Telephone Encounter (Signed)
Will ned to wait forrelease cannot mail medical records without signed release please wait for request.

## 2021-01-18 ENCOUNTER — Telehealth: Payer: Self-pay | Admitting: Internal Medicine

## 2021-01-18 NOTE — Telephone Encounter (Signed)
See other note

## 2021-01-18 NOTE — Telephone Encounter (Signed)
still have not heard from his attorney about need form for medical release to give records to them Can you call again please attorney?  We need to form signed by pt from attorneys office before we can release records but all records are printed and ready to be mailed 1. Call  attorney Loretha Stapler in [place] [phone]  2. Once form received for release or records have pt come to sign so we can get records mailed to above

## 2021-01-19 ENCOUNTER — Ambulatory Visit (INDEPENDENT_AMBULATORY_CARE_PROVIDER_SITE_OTHER): Payer: Medicare Other | Admitting: Internal Medicine

## 2021-01-19 ENCOUNTER — Other Ambulatory Visit: Payer: Self-pay

## 2021-01-19 ENCOUNTER — Encounter: Payer: Self-pay | Admitting: Internal Medicine

## 2021-01-19 VITALS — BP 104/60 | HR 57 | Temp 98.0°F | Ht 68.0 in | Wt 172.8 lb

## 2021-01-19 DIAGNOSIS — M549 Dorsalgia, unspecified: Secondary | ICD-10-CM | POA: Diagnosis not present

## 2021-01-19 DIAGNOSIS — M5442 Lumbago with sciatica, left side: Secondary | ICD-10-CM | POA: Diagnosis not present

## 2021-01-19 DIAGNOSIS — M160 Bilateral primary osteoarthritis of hip: Secondary | ICD-10-CM

## 2021-01-19 DIAGNOSIS — G8929 Other chronic pain: Secondary | ICD-10-CM

## 2021-01-19 DIAGNOSIS — G4733 Obstructive sleep apnea (adult) (pediatric): Secondary | ICD-10-CM | POA: Diagnosis not present

## 2021-01-19 DIAGNOSIS — R937 Abnormal findings on diagnostic imaging of other parts of musculoskeletal system: Secondary | ICD-10-CM | POA: Diagnosis not present

## 2021-01-19 NOTE — Telephone Encounter (Signed)
Patient in office today. Patient gave verbal order/consent to fax his records to attorney Leonard Downing office.   Patient gave verbal consent to fax records as release had not been faxed in by the end of his visit.

## 2021-01-19 NOTE — Telephone Encounter (Signed)
Patient seen in office today, 01/19/21, to discuss.

## 2021-01-19 NOTE — Patient Instructions (Addendum)
Please call lawyer I do not have medical release for to release medical information to lawyer they need to fax that to me [phone] so we can mail the records   Consider covid 19 booster

## 2021-01-19 NOTE — Telephone Encounter (Signed)
Called and spoke to the para-;egal. She will fax this form to [place] now as the Patient is in the office
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
## 2021-01-20 ENCOUNTER — Telehealth: Payer: Self-pay | Admitting: Internal Medicine

## 2021-01-20 NOTE — Addendum Note (Signed)
Addended by: Orland Mustard on: 01/20/2021 01:02 PM   Modules accepted: Orders

## 2021-01-20 NOTE — Telephone Encounter (Signed)
LMTCB

## 2021-01-20 NOTE — Telephone Encounter (Signed)
Agreeable to PT again for back pain? Neurosurgery recommends

## 2021-01-20 NOTE — Telephone Encounter (Signed)
Pt called back returning a call

## 2021-01-20 NOTE — Telephone Encounter (Signed)
Pt would like to be referred for PT someplace here in town. Pt knows he should hear within 7-10 business days about an appointment.

## 2021-01-31 DIAGNOSIS — M5442 Lumbago with sciatica, left side: Secondary | ICD-10-CM | POA: Diagnosis not present

## 2021-02-02 ENCOUNTER — Telehealth: Payer: Self-pay

## 2021-02-02 NOTE — Telephone Encounter (Signed)
Faxed signed plan of care back to [facility] on 02/02/21 to [phone].

## 2021-02-04 DIAGNOSIS — M5442 Lumbago with sciatica, left side: Secondary | ICD-10-CM | POA: Diagnosis not present

## 2021-02-08 ENCOUNTER — Telehealth: Payer: Self-pay

## 2021-02-08 NOTE — Telephone Encounter (Signed)
Pt called and wanted to speak with PCP about ongoing back pain. Scheduled telephone visit for 02/09/21 to discuss

## 2021-02-09 ENCOUNTER — Telehealth: Payer: Self-pay | Admitting: Internal Medicine

## 2021-02-09 ENCOUNTER — Telehealth: Payer: Medicare Other | Admitting: Internal Medicine

## 2021-02-09 NOTE — Telephone Encounter (Signed)
Patient scheduled for appointment today, 3/30, to discuss his lawyer. Patient has been having a hard time communicating with his lawyer in regards to a disability claim regarding his back.   Called the Patient to inform him that our office had received the request for records from his attorney and the Patient had signed the release. We then sent all of this over to the attorney.   Patient states that his attorney received this information and dropped his case. Patient states he did not like this attorney and he now has a new one. Patient states that the former attorney will mail all the information back to the Patient so that it can be given to the new attorney.   Informed the Patient that I would document this and send it for your information to Dr Olivia Mackie McLean-Scocuzza. Informed the Patient that since this is not a medical matter that I will cancel his appointment today with Dr Olivia Mackie McLean-Scocuzza.   Informed the Patient that should his new attorney need anything they will just need to fax a request to our office. That matters concerning his attorney would not need a doctor's appointment.   Patient verbalized understanding

## 2021-02-14 DIAGNOSIS — M5442 Lumbago with sciatica, left side: Secondary | ICD-10-CM | POA: Diagnosis not present

## 2021-02-21 DIAGNOSIS — M5442 Lumbago with sciatica, left side: Secondary | ICD-10-CM | POA: Diagnosis not present

## 2021-03-03 ENCOUNTER — Telehealth: Payer: Self-pay | Admitting: Internal Medicine

## 2021-03-03 DIAGNOSIS — G8929 Other chronic pain: Secondary | ICD-10-CM | POA: Diagnosis not present

## 2021-03-03 DIAGNOSIS — M5442 Lumbago with sciatica, left side: Secondary | ICD-10-CM | POA: Diagnosis not present

## 2021-03-03 NOTE — Telephone Encounter (Signed)
Patient would like someone to call him back. He has questions about his past MRI and the one he just had recently. He also wants to know what no contrast vs contrast does.
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
## 2021-03-03 NOTE — Telephone Encounter (Signed)
Patient was returning call about MRI
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
## 2021-03-03 NOTE — Telephone Encounter (Signed)
Informed the Patient that his last MRI was without contrast. Went over the differences of contrast and non-contrast MRIs. Patient also wanted to know the date and how many Cts he had done in 2019. Patient had 4 Cts in 07/17/2018 and then a MRI 11/08/2018.   Patient states he has more questions but will call back in as he needed to get off of the phone.

## 2021-03-07 NOTE — Telephone Encounter (Signed)
Patient called in about paper work for his back to take to another lawyer

## 2021-03-08 NOTE — Telephone Encounter (Signed)
Pt called back his daughter's address  is   [place]

## 2021-03-08 NOTE — Telephone Encounter (Signed)
Called and informed the Patient that I have printed off the letter written by Dr Olivia Mackie McLean-Scocuzza and all imaging from 2019. Also informed Patient to contact the specialist he saw as well.   Patient verbalized understanding, he would like this mailed to his daughter's address. States he will call back in with the address as he is driving.

## 2021-03-09 NOTE — Telephone Encounter (Signed)
Mailed

## 2021-05-11 ENCOUNTER — Telehealth: Payer: Self-pay

## 2021-05-11 NOTE — Telephone Encounter (Signed)
Lvm for pt to return call, I have a question regarding pt's OSA, would like to know if pt is using a cpap? if so I need to get him enrolled into [employer].

## 2021-05-12 ENCOUNTER — Encounter: Payer: Self-pay | Admitting: Pulmonary Disease

## 2021-05-12 ENCOUNTER — Ambulatory Visit: Payer: Medicare Other | Admitting: Pulmonary Disease

## 2021-05-12 ENCOUNTER — Other Ambulatory Visit: Payer: Self-pay

## 2021-05-12 VITALS — BP 132/70 | HR 64 | Temp 98.1°F | Ht 68.0 in | Wt 175.8 lb

## 2021-05-12 DIAGNOSIS — F17210 Nicotine dependence, cigarettes, uncomplicated: Secondary | ICD-10-CM

## 2021-05-12 DIAGNOSIS — J439 Emphysema, unspecified: Secondary | ICD-10-CM | POA: Diagnosis not present

## 2021-05-12 DIAGNOSIS — G4733 Obstructive sleep apnea (adult) (pediatric): Secondary | ICD-10-CM | POA: Diagnosis not present

## 2021-05-12 DIAGNOSIS — J8410 Pulmonary fibrosis, unspecified: Secondary | ICD-10-CM | POA: Diagnosis not present

## 2021-05-12 NOTE — Progress Notes (Signed)
   [facility]  Chief Complaint  Patient presents with   Follow-up    OSA-    Constitutional:  BP 132/70 (BP Location: Left Arm, Patient Position: Sitting, Cuff Size: Normal)   Pulse 64   Temp 98.1 F (36.7 C) (Oral)   Ht 5\' 8"  (1.727 m)   Wt 175 lb 12.8 oz (79.7 kg)   SpO2 100%   BMI 26.73 kg/m   Past Medical History:  OA, GERD  Past Surgical History:  He  has a past surgical history that includes No past surgeries.  Brief Summary:  Juan [name]. is a 70 y.o. male smoker with obstructive sleep apnea and emphysema.  He is a Proofreader.      Subjective:   He had sleep study in February.  Showed mild sleep apnea, but severe in REM sleep.  CPAP ordered, but he hasn't received this yet.  Still smoking some, but trying to gradually quit.  Breathing okay otherwise.  Physical Exam:   Appearance - well kempt   ENMT - no sinus tenderness, no oral exudate, no LAN, Mallampati 2 airway, no stridor  Respiratory - equal breath sounds bilaterally, no wheezing or rales  CV - s1s2 regular rate and rhythm, no murmurs  Ext - no clubbing, no edema  Skin - no rashes  Psych - normal mood and affect   Pulmonary testing:  PFT 11/09/20 >> FEV1 1.83 (68%), FEV1% 77, TLC 4.50 (67%), DLCO 77%  Chest Imaging:  CT chest 06/01/20 >> few scattered nodules up to 3 mm, mild centrilobular and paraseptal emphysema  Sleep Tests:  PSG 12/22/20 >> AHI 10.4, SpO2 low 87%, REM AHI 51.8  Cardiac Tests:  Echo 07/12/20 >> EF 60 to 65%, mild LVH, RVSP 38 mmHg  Social History:  He  reports that he has been smoking cigarettes. He has a 39.75 pack-year smoking history. He has never used smokeless tobacco. He reports current alcohol use. He reports that he does not use drugs.  Family History:  His family history includes Diabetes in his brother, brother, and mother; Iron deficiency in his mother.     Assessment/Plan:   Obstructive sleep  apnea. - reviewed his sleep study - discussed how sleep apnea can impact his health - driving precautions discussed - will see if we can expedite his auto CPAP set up  Tobacco abuse with changes of mild emphysema on CT chest. - he will gradually try to quit smoking  Time Spent Involved in Patient Care on Day of Examination:  24 minutes  Follow up:   Patient Instructions  Follow up in 4 months  Medication List:   Allergies as of 05/12/2021   No Known Allergies      Medication List        Accurate as of May 12, 2021 10:33 AM. If you have any questions, ask your nurse or doctor.          Cholecalciferol 1.25 MG (50000 UT) capsule Take 1 capsule (50,000 Units total) by mouth once a week.   meloxicam 7.5 MG tablet Commonly known as: MOBIC Take 1 tablet (7.5 mg total) by mouth 2 (two) times daily as needed for pain.   sildenafil 20 MG tablet Commonly known as: REVATIO        Signature:  Juan Mires, MD [place] Pager - [phone] 05/12/2021, 10:33 AM

## 2021-05-12 NOTE — Telephone Encounter (Signed)
Lvm for brad at [facility] in regards to patient not receiving cpap machine, I left a good call back number will try again at a later time.

## 2021-05-12 NOTE — Patient Instructions (Signed)
Follow up in 4 months 

## 2021-06-13 ENCOUNTER — Encounter: Payer: Self-pay | Admitting: Emergency Medicine

## 2021-06-13 ENCOUNTER — Other Ambulatory Visit: Payer: Self-pay

## 2021-06-13 ENCOUNTER — Ambulatory Visit
Admission: EM | Admit: 2021-06-13 | Discharge: 2021-06-13 | Disposition: A | Discharge status: Home / Self Care | Payer: Medicare Other | Attending: Emergency Medicine | Admitting: Emergency Medicine

## 2021-06-13 DIAGNOSIS — W57XXXA Bitten or stung by nonvenomous insect and other nonvenomous arthropods, initial encounter: Secondary | ICD-10-CM

## 2021-06-13 DIAGNOSIS — L089 Local infection of the skin and subcutaneous tissue, unspecified: Secondary | ICD-10-CM | POA: Diagnosis not present

## 2021-06-13 MED ORDER — MUPIROCIN 2 % EX OINT: 1.0000 "application " | TOPICAL_OINTMENT | Freq: Two times a day (BID) | CUTANEOUS | 0 refills | Status: DC

## 2021-06-13 NOTE — ED Triage Notes (Signed)
Patient c/o Insect Bite that occurred 5 days ago.   Patient denies itching.   Patient endorses swelling  Patient endorses " it feels a little wet at times, like it may be draining".   Patient endorses insect bite occurred on RT inner thigh.   Patient has used Triamcinolone cream 0.1% w/ no relief of symptoms.

## 2021-06-13 NOTE — Discharge Instructions (Signed)
Use topical Bactroban ointment as prescribed and keep the area covered with a Band-Aid to help prevent friction against clothing. Increase fluid intake. You may take Benadryl 25-'50mg'$  every 4-6 hours as needed OR Zyrtec '10mg'$  daily for itching/inflammation. You may also take over the counter Famotidine '20mg'$  once daily to help with itching/inflammation. You may apply ice wrapped in a towel to affected area 3-5 times daily for 10-15 minute intervals. See your PCP if bite does not improve in 5-6 days. See your PCP or return to clinic sooner if bite worsens or you develop fever.

## 2021-06-13 NOTE — ED Provider Notes (Signed)
Chief Complaint   Chief Complaint  Patient presents with   Insect Bite     Subjective, HPI  Juan Plumer Nein. is a very pleasant 70 y.o. male who presents with insect bites to right inner thigh region.  Patient states that he noticed these areas about 5 days ago.  Patient states that he has noticed some drainage.  He has been applying triamcinolone cream which has not seemed to make a difference.  Patient does not report taking any over-the-counter remedies such as Tylenol or ibuprofen.  Patient does not report a fever.  He reports some discomfort to the areas along with some redness.  He does not report which type of insect bit him.  History obtained from patient.   Patient's problem list, past medical and social history, medications, and allergies were reviewed by me and updated in Epic.    ROS  See HPI.  Objective   Vitals:   06/13/21 0958  BP: 123/69  Pulse: 62  Resp: 15  Temp: 98.3 F (36.8 C)  SpO2: 95%    Vital signs and nursing note reviewed.  General: Appears well-developed and well-nourished. No acute distress.  HEENT: Normocephalic, atraumatic, hearing grossly intact. EOMI, no drainage. No rhinorrhea. Moist mucous membranes.  Neck: Normal range of motion, neck is supple.  Cardiovascular: Normal rate.  Pulm/Chest: No respiratory distress.   Musculoskeletal: No joint deformity, normal range of motion.  Skin: Two insect bites noted to right inner thigh region with central punctum and drainage.  One area appears to be a budding blister.  Areas are surrounded with mild redness.  Slight TTP.  Data  No results found for any visits on 06/13/21.    Assessment & Plan  1. Nonvenomous insect bite with infection  70 y.o. male presents with insect bites to right inner thigh region.  Patient states that he noticed these areas about 5 days ago.  Patient states that he has noticed some drainage.  He has been applying triamcinolone cream which has not seemed to make a difference.   Patient does not report taking any over-the-counter remedies such as Tylenol or ibuprofen.  Patient does not report a fever.  He reports some discomfort to the areas along with some redness.  He does not report which type of insect bit him.  Chart review completed.  Given symptoms along with assessment findings, likely localized infection of insect bites to right inner thigh.  Given that the patient has been using triamcinolone cream, I feel as if he may need to start using a topical antibiotic ointment.  Rx'd Bactroban to the patient's preferred pharmacy and advised to use twice daily.  Advised to keep area clean and dry.  Advised to apply Band-Aids to help with friction against his clothing.  Also advised of over-the-counter remedies to use which will help with itching/inflammation.  Advised of strict follow-up with his PCP for no improvement or worsening of symptoms.  Patient verbalized understanding and agreed with plan.  Patient stable upon discharge.  Return as needed.  Plan:   Discharge Instructions      Use topical Bactroban ointment as prescribed and keep the area covered with a Band-Aid to help prevent friction against clothing. Increase fluid intake. You may take Benadryl 25-'50mg'$  every 4-6 hours as needed OR Zyrtec '10mg'$  daily for itching/inflammation. You may also take over the counter Famotidine '20mg'$  once daily to help with itching/inflammation. You may apply ice wrapped in a towel to affected area 3-5 times daily for 10-15  minute intervals. See your PCP if bite does not improve in 5-6 days. See your PCP or return to clinic sooner if bite worsens or you develop fever.          Serafina Royals, [place] 06/13/21 1028

## 2021-06-23 ENCOUNTER — Ambulatory Visit (INDEPENDENT_AMBULATORY_CARE_PROVIDER_SITE_OTHER): Payer: Medicare Other | Admitting: Internal Medicine

## 2021-06-23 ENCOUNTER — Encounter: Payer: Self-pay | Admitting: Internal Medicine

## 2021-06-23 ENCOUNTER — Telehealth: Payer: Self-pay | Admitting: Internal Medicine

## 2021-06-23 ENCOUNTER — Other Ambulatory Visit: Payer: Self-pay

## 2021-06-23 VITALS — BP 112/70 | HR 71 | Temp 97.0°F | Ht 68.0 in | Wt 173.8 lb

## 2021-06-23 DIAGNOSIS — I700 Atherosclerosis of aorta: Secondary | ICD-10-CM

## 2021-06-23 DIAGNOSIS — R1013 Epigastric pain: Secondary | ICD-10-CM | POA: Diagnosis not present

## 2021-06-23 DIAGNOSIS — E785 Hyperlipidemia, unspecified: Secondary | ICD-10-CM | POA: Diagnosis not present

## 2021-06-23 DIAGNOSIS — K219 Gastro-esophageal reflux disease without esophagitis: Secondary | ICD-10-CM | POA: Diagnosis not present

## 2021-06-23 DIAGNOSIS — R630 Anorexia: Secondary | ICD-10-CM | POA: Diagnosis not present

## 2021-06-23 DIAGNOSIS — M545 Low back pain, unspecified: Secondary | ICD-10-CM

## 2021-06-23 DIAGNOSIS — E559 Vitamin D deficiency, unspecified: Secondary | ICD-10-CM

## 2021-06-23 DIAGNOSIS — R739 Hyperglycemia, unspecified: Secondary | ICD-10-CM

## 2021-06-23 DIAGNOSIS — Z1329 Encounter for screening for other suspected endocrine disorder: Secondary | ICD-10-CM

## 2021-06-23 DIAGNOSIS — R0789 Other chest pain: Secondary | ICD-10-CM

## 2021-06-23 DIAGNOSIS — Z125 Encounter for screening for malignant neoplasm of prostate: Secondary | ICD-10-CM

## 2021-06-23 MED ORDER — PANTOPRAZOLE SODIUM 40 MG PO TBEC: 40.0000 mg | DELAYED_RELEASE_TABLET | Freq: Every day | ORAL | 1 refills | Status: DC

## 2021-06-23 MED ORDER — CYCLOBENZAPRINE HCL 5 MG PO TABS: 2.5000 mg | ORAL_TABLET | Freq: Every evening | ORAL | 5 refills | Status: DC | PRN

## 2021-06-23 MED ORDER — MELOXICAM 7.5 MG PO TABS: 7.5000 mg | ORAL_TABLET | Freq: Two times a day (BID) | ORAL | 11 refills | Status: DC | PRN

## 2021-06-23 NOTE — Telephone Encounter (Signed)
Lft pt vm to call [facility] to sch. thanks

## 2021-06-23 NOTE — Patient Instructions (Addendum)
[phone] [address]   Heart doctor  [address]    [place] [address]   Dr. Silvio Pate GI doctor  Start protonix 40 mg daily  Phone Fax E-mail Address  [phone] [place] [address]     Specialties     Gastroenterology        Gastroesophageal Reflux Disease, Adult  Gastroesophageal reflux (GER) happens when acid from the stomach flows up into the tube that connects the mouth and the stomach (esophagus). Normally, food travels down the esophagus and stays in the stomach to be digested. With GER, food and stomach acid sometimes move back up into theesophagus. You may have a disease called gastroesophageal reflux disease (GERD) if the reflux: Happens often. Causes frequent or very bad symptoms. Causes problems such as damage to the esophagus. When this happens, the esophagus becomes sore and swollen. Over time, GERD can make small holes (ulcers) in the lining of the esophagus. What are the causes? This condition is caused by a problem with the muscle between the esophagus and the stomach. When this muscle is weak or not normal, it does not close properlyto keep food and acid from coming back up from the stomach. The muscle can be weak because of: Tobacco use. Pregnancy. Having a certain type of hernia (hiatal hernia). Alcohol use. Certain foods and drinks, such as coffee, chocolate, onions, and peppermint. What increases the risk? Being overweight. Having a disease that affects your connective tissue. Taking NSAIDs, such a ibuprofen. What are the signs or symptoms? Heartburn. Difficult or painful swallowing. The feeling of having a lump in the throat. A bitter taste in the mouth. Bad breath. Having a lot of saliva. Having an upset or bloated stomach. Burping. Chest pain. Different conditions can cause chest pain. Make sure you see your doctor if you have chest pain. Shortness of breath or wheezing. A long-term cough or a  cough at night. Wearing away of the surface of teeth (tooth enamel). Weight loss. How is this treated? Making changes to your diet. Taking medicine. Having surgery. Treatment will depend on how bad your symptoms are. Follow these instructions at home: Eating and drinking  Follow a diet as told by your doctor. You may need to avoid foods and drinks such as: Coffee and tea, with or without caffeine. Drinks that contain alcohol. Energy drinks and sports drinks. Bubbly (carbonated) drinks or sodas. Chocolate and cocoa. Peppermint and mint flavorings. Garlic and onions. Horseradish. Spicy and acidic foods. These include peppers, chili powder, curry powder, vinegar, hot sauces, and BBQ sauce. Citrus fruit juices and citrus fruits, such as oranges, lemons, and limes. Tomato-based foods. These include red sauce, chili, salsa, and pizza with red sauce. Fried and fatty foods. These include donuts, french fries, potato chips, and high-fat dressings. High-fat meats. These include hot dogs, rib eye steak, sausage, ham, and bacon. High-fat dairy items, such as whole milk, butter, and cream cheese. Eat small meals often. Avoid eating large meals. Avoid drinking large amounts of liquid with your meals. Avoid eating meals during the 2-3 hours before bedtime. Avoid lying down right after you eat. Do not exercise right after you eat.  Lifestyle  Do not smoke or use any products that contain nicotine or tobacco. If you need help quitting, ask your doctor. Try to lower your stress. If you need help doing this, ask your doctor. If you are overweight, lose an amount of weight that is healthy for you. Ask  your doctor about a safe weight loss goal.  General instructions Pay attention to any changes in your symptoms. Take over-the-counter and prescription medicines only as told by your doctor. Do not take aspirin, ibuprofen, or other NSAIDs unless your doctor says it is okay. Wear loose clothes. Do  not wear anything tight around your waist. Raise (elevate) the head of your bed about 6 inches (15 cm). You may need to use a wedge to do this. Avoid bending over if this makes your symptoms worse. Keep all follow-up visits. Contact a doctor if: You have new symptoms. You lose weight and you do not know why. You have trouble swallowing or it hurts to swallow. You have wheezing or a cough that keeps happening. You have a hoarse voice. Your symptoms do not get better with treatment. Get help right away if: You have sudden pain in your arms, neck, jaw, teeth, or back. You suddenly feel sweaty, dizzy, or light-headed. You have chest pain or shortness of breath. You vomit and the vomit is green, yellow, or black, or it looks like blood or coffee grounds. You faint. Your poop (stool) is red, bloody, or black. You cannot swallow, drink, or eat. These symptoms may represent a serious problem that is an emergency. Do not wait to see if the symptoms will go away. Get medical help right away. Call your local emergency services (911 in the [place] Do not drive yourself to the hospital. Summary If a person has gastroesophageal reflux disease (GERD), food and stomach acid move back up into the esophagus and cause symptoms or problems such as damage to the esophagus. Treatment will depend on how bad your symptoms are. Follow a diet as told by your doctor. Take all medicines only as told by your doctor. This information is not intended to replace advice given to you by your health care provider. Make sure you discuss any questions you have with your healthcare provider. Document Revised: 05/10/2020 Document Reviewed: 05/10/2020 Elsevier Patient Education  2022 [place] for Gastroesophageal Reflux Disease, Adult When you have gastroesophageal reflux disease (GERD), the foods you eat and your eating habits are very important. Choosing the right foods can help ease your discomfort.  Think about working with a food expert (dietitian) to help you make good choices. What are tips for following this plan? Reading food labels Look for foods that are low in saturated fat. Foods that may help with your symptoms include: Foods that have less than 5% of daily value (DV) of fat. Foods that have 0 grams of trans fat. Cooking Do not fry your food. Cook your food by baking, steaming, grilling, or broiling. These are all methods that do not need a lot of fat for cooking. To add flavor, try to use herbs that are low in spice and acidity. Meal planning  Choose healthy foods that are low in fat, such as: Fruits and vegetables. Whole grains. Low-fat dairy products. Lean meats, fish, and poultry. Eat small meals often instead of eating 3 large meals each day. Eat your meals slowly in a place where you are relaxed. Avoid bending over or lying down until 2-3 hours after eating. Limit high-fat foods such as fatty meats or fried foods. Limit your intake of fatty foods, such as oils, butter, and shortening. Avoid the following as told by your doctor: Foods that cause symptoms. These may be different for different people. Keep a food diary to keep track of foods that cause symptoms. Alcohol.  Drinking a lot of liquid with meals. Eating meals during the 2-3 hours before bed.  Lifestyle Stay at a healthy weight. Ask your doctor what weight is healthy for you. If you need to lose weight, work with your doctor to do so safely. Exercise for at least 30 minutes on 5 or more days each week, or as told by your doctor. Wear loose-fitting clothes. Do not smoke or use any products that contain nicotine or tobacco. If you need help quitting, ask your doctor. Sleep with the head of your bed higher than your feet. Use a wedge under the mattress or blocks under the bed frame to raise the head of the bed. Chew sugar-free gum after meals. What foods should eat?  Eat a healthy, well-balanced diet of  fruits, vegetables, whole grains, low-fatdairy products, lean meats, fish, and poultry. Each person is different. Foods that may cause symptoms in one person may not cause any symptoms inanother person. Work with your doctor to find foods that are safe for you. The items listed above may not be a complete list of what you can eat and drink. Contact a food expert for more options. What foods should I avoid? Limiting some of these foods may help in managing the symptoms of GERD. Everyone is different. Talk with a food expert or your doctor to help you findthe exact foods to avoid, if any. Fruits Any fruits prepared with added fat. Any fruits that cause symptoms. For some people, this may include citrus fruits, such as oranges, grapefruit, pineapple,and lemons. Vegetables Deep-fried vegetables. [place] fries. Any vegetables prepared with added fat. Any vegetables that cause symptoms. For some people, this may include tomatoesand tomato products, chili peppers, onions and garlic, and horseradish. Grains Pastries or quick breads with added fat. Meats and other proteins High-fat meats, such as fatty beef or pork, hot dogs, ribs, ham, sausage, salami, and bacon. Fried meat or protein, including fried fish and friedchicken. Nuts and nut butters, in large amounts. Dairy Whole milk and chocolate milk. Sour cream. Cream. Ice cream. Cream cheese.Milkshakes. Fats and oils Butter. Margarine. Shortening. Ghee. Beverages Coffee and tea, with or without caffeine. Carbonated beverages. Sodas. Energy drinks. Fruit juice made with acidic fruits, such as orange or grapefruit.Tomato juice. Alcoholic drinks. Sweets and desserts Chocolate and cocoa. Donuts. Seasonings and condiments Pepper. Peppermint and spearmint. Added salt. Any condiments, herbs, or seasonings that cause symptoms. For some people, this may include curry, hotsauce, or vinegar-based salad dressings. The items listed above may not be a complete list  of what you should not eat and drink. Contact a food expert for more options. Questions to ask your doctor Diet and lifestyle changes are often the first steps that are taken to manage symptoms of GERD. If diet and lifestyle changes do not help, talk with yourdoctor about taking medicines. Where to find more information [employer] for Gastrointestinal Disorders: aboutgerd.org Summary When you have GERD, food and lifestyle choices are very important in easing your symptoms. Eat small meals often instead of 3 large meals a day. Eat your meals slowly and in a place where you are relaxed. Avoid bending over or lying down until 2-3 hours after eating. Limit high-fat foods such as fatty meats or fried foods. This information is not intended to replace advice given to you by your health care provider. Make sure you discuss any questions you have with your healthcare provider. Document Revised: 05/10/2020 Document Reviewed: 05/10/2020 Elsevier Patient Education  [place].

## 2021-06-23 NOTE — Telephone Encounter (Signed)
Pt would like to talk to Dr [facility] about his paperwork. Please advise
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
## 2021-06-23 NOTE — Telephone Encounter (Signed)
Patient is requesting a letter from [place] to his attorney stating that he will have to take the for his back pain to add to his case.He stated this is urgent for his insurance company and attorney.Please advise the patient.

## 2021-06-24 ENCOUNTER — Telehealth: Payer: Self-pay

## 2021-06-24 NOTE — Telephone Encounter (Signed)
Informed patient his PCP and Dr. Fuller Plan would like him to be evaluated with an Upper Endoscopy for his GERD. Patient states his appt has to be on a Friday. Scheduled EGD on 07/22/21. Patient states he is out of town and would like for me to call him back in 30 minutes so he can get out of traffic.

## 2021-06-24 NOTE — Telephone Encounter (Signed)
Called patient back and informed him his nurse visit is on 07/13/21 at 1pm and EGD is scheduled for 07/22/21 at 10am. Patient verbalized understanding.

## 2021-06-24 NOTE — Telephone Encounter (Signed)
-----   Message from Ladene Artist, MD sent at 06/23/2021  4:20 PM EDT ----- We will contact him to schedule an EGD. Thanks.  ----- Message ----- From: McLean-Scocuzza, Nino Glow, MD Sent: 06/23/2021   2:35 PM EDT To: Ladene Artist, MD  Worsening GERD can you all reach out to pt for EGD? Thank you and having loss of appetite

## 2021-06-27 ENCOUNTER — Telehealth: Payer: Self-pay | Admitting: Internal Medicine

## 2021-06-27 NOTE — Telephone Encounter (Signed)
Letter placed in red folder to sign & will call patient so that he may pick up.

## 2021-06-27 NOTE — Telephone Encounter (Signed)
Call pt If you would to provide a generic letter that states flexeril was started by PCP, Dr Aundra Dubin, on 06/23/21 That is fine.  Please state that PCP is currently on medical leave and covering NP has signed letter  I can sign while she is out.

## 2021-06-27 NOTE — Telephone Encounter (Signed)
Patient called wanting to know if his letter for his lawyer has been written. He states that Dr Aundra Dubin put him on a muscle relaxer on 06/23/2021 for muscle spasms, and that it may help with his back.
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
## 2021-06-28 ENCOUNTER — Telehealth: Payer: Self-pay | Admitting: Family

## 2021-06-28 NOTE — Telephone Encounter (Signed)
Looks like this should have been sent to you.  Let me know if there is anything I need to do.

## 2021-06-28 NOTE — Telephone Encounter (Signed)
Spoke with pt to let him know that the letter he requested is complete and ready for pick up. Letter placed up front in folder.

## 2021-06-28 NOTE — Telephone Encounter (Signed)
Placed on your computer, I have signed

## 2021-06-28 NOTE — Telephone Encounter (Signed)
Letter dated 06/27/21 was added to chart in error.  Mable Paris, NP

## 2021-07-13 ENCOUNTER — Other Ambulatory Visit: Payer: Self-pay

## 2021-07-13 ENCOUNTER — Ambulatory Visit (AMBULATORY_SURGERY_CENTER): Payer: Medicare Other | Admitting: *Deleted

## 2021-07-13 VITALS — Ht 68.0 in | Wt 173.0 lb

## 2021-07-13 DIAGNOSIS — K219 Gastro-esophageal reflux disease without esophagitis: Secondary | ICD-10-CM

## 2021-07-13 NOTE — Progress Notes (Signed)
Patient's pre-visit was done today over the phone with the patient due to COVID-19 pandemic. Name,DOB and address verified. Insurance verified. Patient denies any allergies to Eggs and Soy. Patient denies any problems with anesthesia/sedation. Patient denies taking diet pills or blood thinners. No home Oxygen. Packet of Prep instructions mailed to patient including a copy of a consent form-pt is aware. Patient understands to call us back with any questions or concerns. Patient is aware of our care-partner policy and 0000000 safety protocol.   The patient is COVID-19 vaccinated.

## 2021-07-19 ENCOUNTER — Encounter: Payer: Self-pay | Admitting: Gastroenterology

## 2021-07-22 ENCOUNTER — Other Ambulatory Visit: Payer: Self-pay

## 2021-07-22 ENCOUNTER — Ambulatory Visit (AMBULATORY_SURGERY_CENTER): Payer: Medicare Other | Admitting: Gastroenterology

## 2021-07-22 ENCOUNTER — Encounter: Payer: Self-pay | Admitting: Gastroenterology

## 2021-07-22 VITALS — BP 148/74 | HR 49 | Temp 98.0°F | Resp 21 | Ht 68.0 in | Wt 173.0 lb

## 2021-07-22 DIAGNOSIS — K2970 Gastritis, unspecified, without bleeding: Secondary | ICD-10-CM

## 2021-07-22 DIAGNOSIS — K31A0 Gastric intestinal metaplasia, unspecified: Secondary | ICD-10-CM | POA: Diagnosis not present

## 2021-07-22 DIAGNOSIS — K219 Gastro-esophageal reflux disease without esophagitis: Secondary | ICD-10-CM | POA: Diagnosis not present

## 2021-07-22 DIAGNOSIS — R1013 Epigastric pain: Secondary | ICD-10-CM

## 2021-07-22 DIAGNOSIS — K2950 Unspecified chronic gastritis without bleeding: Secondary | ICD-10-CM | POA: Diagnosis not present

## 2021-07-22 DIAGNOSIS — K259 Gastric ulcer, unspecified as acute or chronic, without hemorrhage or perforation: Secondary | ICD-10-CM

## 2021-07-22 MED ORDER — SODIUM CHLORIDE 0.9 % IV SOLN: 500.0000 mL | Freq: Once | INTRAVENOUS | Status: DC

## 2021-07-22 NOTE — Progress Notes (Signed)
Called to room to assist during endoscopic procedure.  Patient ID and intended procedure confirmed with present staff. Received instructions for my participation in the procedure from the performing physician.  

## 2021-07-22 NOTE — Patient Instructions (Signed)
Handout was given to your care partner on Gastritis. Per Dr. Fuller Plan, consider discontinuing MOBIC, and avoiding NSAIDS, defer to your primary care md. You may resume your other current medications today. Await biopsy results.  May take 1-3 weeks to receive pathology results. Please call if any questions or concerns.     YOU HAD AN ENDOSCOPIC PROCEDURE TODAY AT [facility]:   Refer to the procedure report that was given to you for any specific questions about what was found during the examination.  If the procedure report does not answer your questions, please call your gastroenterologist to clarify.  If you requested that your care partner not be given the details of your procedure findings, then the procedure report has been included in a sealed envelope for you to review at your convenience later.  YOU SHOULD EXPECT: Some feelings of bloating in the abdomen. Passage of more gas than usual.  Walking can help get rid of the air that was put into your GI tract during the procedure and reduce the bloating. If you had a lower endoscopy (such as a colonoscopy or flexible sigmoidoscopy) you may notice spotting of blood in your stool or on the toilet paper. If you underwent a bowel prep for your procedure, you may not have a normal bowel movement for a few days.  Please Note:  You might notice some irritation and congestion in your nose or some drainage.  This is from the oxygen used during your procedure.  There is no need for concern and it should clear up in a day or so.  SYMPTOMS TO REPORT IMMEDIATELY:  Following upper endoscopy (EGD)  Vomiting of blood or coffee ground material  New chest pain or pain under the shoulder blades  Painful or persistently difficult swallowing  New shortness of breath  Fever of 100F or higher  Black, tarry-looking stools  For urgent or emergent issues, a gastroenterologist can be reached at any hour by calling [phone]. Do not use MyChart  messaging for urgent concerns.    DIET:  We do recommend a small meal at first, but then you may proceed to your regular diet.  Drink plenty of fluids but you should avoid alcoholic beverages for 24 hours.  ACTIVITY:  You should plan to take it easy for the rest of today and you should NOT DRIVE or use heavy machinery until tomorrow (because of the sedation medicines used during the test).    FOLLOW UP: Our staff will call the number listed on your records 48-72 hours following your procedure to check on you and address any questions or concerns that you may have regarding the information given to you following your procedure. If we do not reach you, we will leave a message.  We will attempt to reach you two times.  During this call, we will ask if you have developed any symptoms of COVID 19. If you develop any symptoms (ie: fever, flu-like symptoms, shortness of breath, cough etc.) before then, please call [phone].  If you test positive for Covid 19 in the 2 weeks post procedure, please call and report this information to [place].    If any biopsies were taken you will be contacted by phone or by letter within the next 1-3 weeks.  Please call us at [phone] if you have not heard about the biopsies in 3 weeks.    SIGNATURES/CONFIDENTIALITY: You and/or your care partner have signed paperwork which will be entered into your electronic medical  record.  These signatures attest to the fact that that the information above on your After Visit Summary has been reviewed and is understood.  Full responsibility of the confidentiality of this discharge information lies with you and/or your care-partner.

## 2021-07-22 NOTE — Progress Notes (Signed)
A and O x3. Report to RN. Tolerated MAC anesthesia well.Teeth unchanged after procedure. 

## 2021-07-22 NOTE — Progress Notes (Signed)
Pt's states no medical or surgical changes since previsit or office visit.  ° °CHECK-IN-AER ° °V/S-DT °

## 2021-07-22 NOTE — Op Note (Signed)
[facility] Patient Name: Juan [name] Procedure Date: 07/22/2021 10:13 AM MRN: [id number] Endoscopist: Ladene Artist , MD Age: 70 Referring MD:  Date of Birth: [birth year] Gender: Male Account #: [id number] Procedure:                Upper GI endoscopy Indications:              Epigastric abdominal pain, Suspected                            gastroesophageal reflux disease Medicines:                Monitored Anesthesia Care Procedure:                Pre-Anesthesia Assessment:                           - Prior to the procedure, a History and Physical                            was performed, and patient medications and                            allergies were reviewed. The patient's tolerance of                            previous anesthesia was also reviewed. The risks                            and benefits of the procedure and the sedation                            options and risks were discussed with the patient.                            All questions were answered, and informed consent                            was obtained. Prior Anticoagulants: The patient has                            taken no previous anticoagulant or antiplatelet                            agents. ASA Grade Assessment: II - A patient with                            mild systemic disease. After reviewing the risks                            and benefits, the patient was deemed in                            satisfactory condition to undergo the procedure.  After obtaining informed consent, the endoscope was                            passed under direct vision. Throughout the                            procedure, the patient's blood pressure, pulse, and                            oxygen saturations were monitored continuously. The                            GIF [id number] was introduced through the                            mouth, and advanced to the second part of  duodenum.                            The upper GI endoscopy was accomplished without                            difficulty. The patient tolerated the procedure                            well. Scope In: Scope Out: Findings:                 The examined esophagus was normal.                           Multiple dispersed small erosions with no bleeding                            and no stigmata of recent bleeding were found in                            the gastric antrum. Biopsies were taken with a cold                            forceps for histology.                           The exam of the stomach was otherwise normal.                           The duodenal bulb and second portion of the                            duodenum were normal. Complications:            No immediate complications. Estimated Blood Loss:     Estimated blood loss was minimal. Impression:               - Normal esophagus.                           -  Erosive gastropathy with no bleeding and no                            stigmata of recent bleeding. Biopsied.                           - Normal duodenal bulb and second portion of the                            duodenum. Recommendation:           - Patient has a contact number available for                            emergencies. The signs and symptoms of potential                            delayed complications were discussed with the                            patient. Return to normal activities tomorrow.                            Written discharge instructions were provided to the                            patient.                           - Resume previous diet.                           - Continue present medications.                           - Await pathology results.                           - Consider discontinuing Mobic and avoiding NSAIDs,                            defer to PCP to review. Ladene Artist, MD 07/22/2021 10:41:13 AM This report has  been signed electronically.
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
## 2021-07-26 ENCOUNTER — Telehealth: Payer: Self-pay

## 2021-07-26 NOTE — Telephone Encounter (Signed)
  Follow up Call-  Call back number 07/22/2021 02/09/2020  Post procedure Call Back phone  # [phone]  Permission to leave phone message Yes Yes  Some recent data might be hidden     Patient questions:  Do you have a fever, pain , or abdominal swelling? No. Pain Score  0 *  Have you tolerated food without any problems? Yes.    Have you been able to return to your normal activities? Yes.    Do you have any questions about your discharge instructions: Diet   No. Medications  No. Follow up visit  No.  Do you have questions or concerns about your Care? No.  Actions: * If pain score is 4 or above: No action needed, pain <4. Have you developed a fever since your procedure? no  2.   Have you had an respiratory symptoms (SOB or cough) since your procedure? no  3.   Have you tested positive for COVID 19 since your procedure no  4.   Have you had any family members/close contacts diagnosed with the COVID 19 since your procedure?  no   If yes to any of these questions please route to Joylene John, RN and Joella Prince, RN

## 2021-07-26 NOTE — Telephone Encounter (Signed)
  Follow up Call-  Call back number 07/22/2021 02/09/2020  Post procedure Call Back phone  # [phone]  Permission to leave phone message Yes Yes  Some recent data might be hidden     Left message

## 2021-07-26 NOTE — Telephone Encounter (Signed)
Inbound call from patient states he is doing fine just urinating more than usual.

## 2021-08-04 ENCOUNTER — Encounter: Payer: Self-pay | Admitting: Gastroenterology

## 2021-08-08 ENCOUNTER — Telehealth: Payer: Self-pay | Admitting: Internal Medicine

## 2021-08-08 DIAGNOSIS — G4733 Obstructive sleep apnea (adult) (pediatric): Secondary | ICD-10-CM | POA: Diagnosis not present
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
## 2021-08-08 NOTE — Telephone Encounter (Signed)
Patient states that he found the number and does not need a call back.

## 2021-08-08 NOTE — Telephone Encounter (Signed)
Patient calling back and states he has picked the CPAP up and will start using it today.   He was told to follow up with his PCP 90 days after having this. Patient already scheduled for follow up 09/22/21.

## 2021-08-10 ENCOUNTER — Encounter (INDEPENDENT_AMBULATORY_CARE_PROVIDER_SITE_OTHER): Payer: Self-pay

## 2021-08-10 ENCOUNTER — Telehealth: Payer: Self-pay

## 2021-08-10 ENCOUNTER — Ambulatory Visit (INDEPENDENT_AMBULATORY_CARE_PROVIDER_SITE_OTHER): Payer: Medicare Other

## 2021-08-10 VITALS — Ht 68.0 in | Wt 173.0 lb

## 2021-08-10 DIAGNOSIS — Z0000 Encounter for general adult medical examination without abnormal findings: Secondary | ICD-10-CM | POA: Diagnosis not present

## 2021-08-10 NOTE — Patient Instructions (Addendum)
  Mr. Juan [name] , Thank you for taking time to come for your Medicare Wellness Visit. I appreciate your ongoing commitment to your health goals. Please review the following plan we discussed and let me know if I can assist you in the future.   These are the goals we discussed:  Goals       Patient Stated     maintain weight (pt-stated)      Stay active Healthy diet        This is a list of the screening recommended for you and due dates:  Health Maintenance  Topic Date Due   COVID-19 Vaccine (3 - Booster for Pfizer series) 08/26/2021*   Zoster (Shingles) Vaccine (1 of 2) 11/09/2021*   Tetanus Vaccine  08/10/2022*   Colon Cancer Screening  02/08/2030   Hepatitis C Screening: USPSTF Recommendation to screen - Ages 18-79 yo.  Completed   HPV Vaccine  Aged Out   Flu Shot  Discontinued  *Topic was postponed. The date shown is not the original due date.
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
## 2021-08-10 NOTE — Telephone Encounter (Signed)
Unable to reach patient for scheduled AWV on preferred number. Left message to call office back. Okay to reschedule.

## 2021-08-11 ENCOUNTER — Telehealth: Payer: Self-pay | Admitting: Gastroenterology

## 2021-08-11 NOTE — Telephone Encounter (Signed)
Inbound call from patient requesting a call from a nurse with procedure results please.

## 2021-08-12 NOTE — Telephone Encounter (Signed)
Patient notified of the results and recommendations in letter.  All questions answered.

## 2021-08-31 ENCOUNTER — Encounter: Payer: Self-pay | Admitting: Cardiovascular Disease

## 2021-08-31 ENCOUNTER — Ambulatory Visit: Payer: Medicare Other | Admitting: Cardiovascular Disease

## 2021-08-31 ENCOUNTER — Other Ambulatory Visit: Payer: Self-pay

## 2021-08-31 VITALS — BP 152/84 | HR 53 | Ht 68.5 in | Wt 167.6 lb

## 2021-08-31 DIAGNOSIS — E7800 Pure hypercholesterolemia, unspecified: Secondary | ICD-10-CM

## 2021-08-31 DIAGNOSIS — R9431 Abnormal electrocardiogram [ECG] [EKG]: Secondary | ICD-10-CM | POA: Diagnosis not present

## 2021-08-31 DIAGNOSIS — I700 Atherosclerosis of aorta: Secondary | ICD-10-CM | POA: Diagnosis not present

## 2021-08-31 DIAGNOSIS — I2721 Secondary pulmonary arterial hypertension: Secondary | ICD-10-CM

## 2021-08-31 DIAGNOSIS — J432 Centrilobular emphysema: Secondary | ICD-10-CM | POA: Diagnosis not present

## 2021-08-31 DIAGNOSIS — G4733 Obstructive sleep apnea (adult) (pediatric): Secondary | ICD-10-CM
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
## 2021-08-31 NOTE — Patient Instructions (Signed)
Medication Instructions:  No changes *If you need a refill on your cardiac medications before your next appointment, please call your pharmacy*   Lab Work: None ordered If you have labs (blood work) drawn today and your tests are completely normal, you will receive your results only by: MyChart Message (if you have MyChart) OR A paper copy in the mail If you have any lab test that is abnormal or we need to change your treatment, we will call you to review the results.   Testing/Procedures: Dr. Croitoru has ordered a CT coronary calcium score. This test is done at [address]. This is $99 out of pocket.   Coronary CalciumScan A coronary calcium scan is an imaging test used to look for deposits of calcium and other fatty materials (plaques) in the inner lining of the blood vessels of the heart (coronary arteries). These deposits of calcium and plaques can partly clog and narrow the coronary arteries without producing any symptoms or warning signs. This puts a person at risk for a heart attack. This test can detect these deposits before symptoms develop. Tell a health care provider about: Any allergies you have. All medicines you are taking, including vitamins, herbs, eye drops, creams, and over-the-counter medicines. Any problems you or family members have had with anesthetic medicines. Any blood disorders you have. Any surgeries you have had. Any medical conditions you have. Whether you are pregnant or may be pregnant. What are the risks? Generally, this is a safe procedure. However, problems may occur, including: Harm to a pregnant woman and her unborn baby. This test involves the use of radiation. Radiation exposure can be dangerous to a pregnant woman and her unborn baby. If you are pregnant, you generally should not have this procedure done. Slight increase in the risk of cancer. This is because of the radiation involved in the test. What happens before the  procedure? No preparation is needed for this procedure. What happens during the procedure? You will undress and remove any jewelry around your neck or chest. You will put on a hospital gown. Sticky electrodes will be placed on your chest. The electrodes will be connected to an electrocardiogram (ECG) machine to record a tracing of the electrical activity of your heart. A CT scanner will take pictures of your heart. During this time, you will be asked to lie still and hold your breath for 2-3 seconds while a picture of your heart is being taken. The procedure may vary among health care providers and hospitals. What happens after the procedure? You can get dressed. You can return to your normal activities. It is up to you to get the results of your test. Ask your health care provider, or the department that is doing the test, when your results will be ready. Summary A coronary calcium scan is an imaging test used to look for deposits of calcium and other fatty materials (plaques) in the inner lining of the blood vessels of the heart (coronary arteries). Generally, this is a safe procedure. Tell your health care provider if you are pregnant or may be pregnant. No preparation is needed for this procedure. A CT scanner will take pictures of your heart. You can return to your normal activities after the scan is done. This information is not intended to replace advice given to you by your health care provider. Make sure you discuss any questions you have with your health care provider. Document Released: 04/27/2008 Document Revised: 09/18/2016 Document Reviewed: 09/18/2016   Elsevier Interactive Patient Education  2017 [employer]    Follow-Up: At [facility], you and your health needs are our priority.  As part of our continuing mission to provide you with exceptional heart care, we have created designated Provider Care Teams.  These Care Teams include your primary Cardiologist (physician) and  Advanced Practice Providers (APPs -  Physician Assistants and Nurse Practitioners) who all work together to provide you with the care you need, when you need it.  We recommend signing up for the patient portal called "MyChart".  Sign up information is provided on this After Visit Summary.  MyChart is used to connect with patients for Virtual Visits (Telemedicine).  Patients are able to view lab/test results, encounter notes, upcoming appointments, etc.  Non-urgent messages can be sent to your provider as well.   To learn more about what you can do with MyChart, go to [email or web address].    Your next appointment:   Follow up as needed with Dr. Croitoru  

## 2021-09-07 DIAGNOSIS — G4733 Obstructive sleep apnea (adult) (pediatric): Secondary | ICD-10-CM | POA: Diagnosis not present

## 2021-09-22 ENCOUNTER — Other Ambulatory Visit: Payer: Self-pay

## 2021-09-22 ENCOUNTER — Encounter: Payer: Self-pay | Admitting: Internal Medicine

## 2021-09-22 ENCOUNTER — Ambulatory Visit (INDEPENDENT_AMBULATORY_CARE_PROVIDER_SITE_OTHER): Payer: Medicare Other | Admitting: Internal Medicine

## 2021-09-22 VITALS — BP 116/80 | HR 65 | Temp 96.8°F | Ht 68.5 in | Wt 171.6 lb

## 2021-09-22 DIAGNOSIS — I700 Atherosclerosis of aorta: Secondary | ICD-10-CM | POA: Diagnosis not present

## 2021-09-22 DIAGNOSIS — Z125 Encounter for screening for malignant neoplasm of prostate: Secondary | ICD-10-CM

## 2021-09-22 DIAGNOSIS — R918 Other nonspecific abnormal finding of lung field: Secondary | ICD-10-CM

## 2021-09-22 DIAGNOSIS — F17210 Nicotine dependence, cigarettes, uncomplicated: Secondary | ICD-10-CM

## 2021-09-22 DIAGNOSIS — J449 Chronic obstructive pulmonary disease, unspecified: Secondary | ICD-10-CM | POA: Diagnosis not present

## 2021-09-22 DIAGNOSIS — E559 Vitamin D deficiency, unspecified: Secondary | ICD-10-CM

## 2021-09-22 DIAGNOSIS — Z1329 Encounter for screening for other suspected endocrine disorder: Secondary | ICD-10-CM

## 2021-09-22 DIAGNOSIS — E785 Hyperlipidemia, unspecified: Secondary | ICD-10-CM

## 2021-09-22 DIAGNOSIS — Z0000 Encounter for general adult medical examination without abnormal findings: Secondary | ICD-10-CM | POA: Diagnosis not present

## 2021-09-22 DIAGNOSIS — R739 Hyperglycemia, unspecified: Secondary | ICD-10-CM

## 2021-09-22 MED ORDER — ATORVASTATIN CALCIUM 10 MG PO TABS: 10.0000 mg | ORAL_TABLET | Freq: Every day | ORAL | 3 refills | Status: DC
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
## 2021-09-22 NOTE — Patient Instructions (Addendum)
Testing/Procedures: Dr. Sallyanne Kuster (heart doctor)  has ordered a CT coronary calcium score. This test is done at [address]. This is $99 out of pocket. [phone]-call to schedule   Consider 2 more covid 19 shots pfizer   Results for Juan [name], Juan [name] (MRN [id number]) as of 09/22/2021 10:22  Ref. Range 11/23/2020 09:52  Cholesterol Latest Ref Range: 0 - 200 mg/dL 208 (H)  HDL Cholesterol Latest Ref Range: >39.00 mg/dL 63.40  LDL (calc) Latest Ref Range: 0 - 99 mg/dL 134 (H)  NonHDL Unknown 144.11  Triglycerides Latest Ref Range: 0.0 - 149.0 mg/dL 52.0  VLDL Latest Ref Range: 0.0 - 40.0 mg/dL 10.4   High Cholesterol High cholesterol is a condition in which the blood has high levels of a white, waxy substance similar to fat (cholesterol). The liver makes all the cholesterol that the body needs. The human body needs small amounts of cholesterol to help build cells. A person gets extra or excess cholesterol from the food that he or she eats. The blood carries cholesterol from the liver to the rest of the body. If you have high cholesterol, deposits (plaques) may build up on the walls of your arteries. Arteries are the blood vessels that carry blood away from your heart. These plaques make the arteries narrow and stiff. Cholesterol plaques increase your risk for heart attack and stroke. Work with your health care provider to keep your cholesterol levels in a healthy range. What increases the risk? The following factors may make you more likely to develop this condition: Eating foods that are high in animal fat (saturated fat) or cholesterol. Being overweight. Not getting enough exercise. A family history of high cholesterol (familial hypercholesterolemia). Use of tobacco products. Having diabetes. What are the signs or symptoms? In most cases, high cholesterol does not usually cause any symptoms. In severe cases, very high cholesterol levels can cause: Fatty  bumps under the skin (xanthomas). A white or gray ring around the black center (pupil) of the eye. How is this diagnosed? This condition may be diagnosed based on the results of a blood test. If you are older than 71 years of age, your health care provider may check your cholesterol levels every 4-6 years. You may be checked more often if you have high cholesterol or other risk factors for heart disease. The blood test for cholesterol measures: "Bad" cholesterol, or LDL cholesterol. This is the main type of cholesterol that causes heart disease. The desired level is less than 100 mg/dL (2.59 mmol/L). "Good" cholesterol, or HDL cholesterol. HDL helps protect against heart disease by cleaning the arteries and carrying the LDL to the liver for processing. The desired level for HDL is 60 mg/dL (1.55 mmol/L) or higher. Triglycerides. These are fats that your body can store or burn for energy. The desired level is less than 150 mg/dL (1.69 mmol/L). Total cholesterol. This measures the total amount of cholesterol in your blood and includes LDL, HDL, and triglycerides. The desired level is less than 200 mg/dL (5.17 mmol/L). How is this treated? Treatment for high cholesterol starts with lifestyle changes, such as diet and exercise. Diet changes. You may be asked to eat foods that have more fiber and less saturated fats or added sugar. Lifestyle changes. These may include regular exercise, maintaining a healthy weight, and quitting use of tobacco products. Medicines. These are given when diet and lifestyle changes have not worked. You may be prescribed a statin medicine to help lower  your cholesterol levels. Follow these instructions at home: Eating and drinking  Eat a healthy, balanced diet. This diet includes: Daily servings of a variety of fresh, frozen, or canned fruits and vegetables. Daily servings of whole grain foods that are rich in fiber. Foods that are low in saturated fats and trans fats.  These include poultry and fish without skin, lean cuts of meat, and low-fat dairy products. A variety of fish, especially oily fish that contain omega-3 fatty acids. Aim to eat fish at least 2 times a week. Avoid foods and drinks that have added sugar. Use healthy cooking methods, such as roasting, grilling, broiling, baking, poaching, steaming, and stir-frying. Do not fry your food except for stir-frying. If you drink alcohol: Limit how much you have to: 0-1 drink a day for women who are not pregnant. 0-2 drinks a day for men. Know how much alcohol is in a drink. In the [place] one drink equals one 12 oz bottle of beer (355 mL), one 5 oz glass of wine (148 mL), or one 1 oz glass of hard liquor (44 mL). Lifestyle  Get regular exercise. Aim to exercise for a total of 150 minutes a week. Increase your activity level by doing activities such as gardening, walking, and taking the stairs. Do not use any products that contain nicotine or tobacco. These products include cigarettes, chewing tobacco, and vaping devices, such as e-cigarettes. If you need help quitting, ask your health care provider. General instructions Take over-the-counter and prescription medicines only as told by your health care provider. Keep all follow-up visits. This is important. Where to find more information American Heart Association: www.heart.org National Heart, Lung, and Blood Institute: https://wilson-eaton.com/ Contact a health care provider if: You have trouble achieving or maintaining a healthy diet or weight. You are starting an exercise program. You are unable to stop smoking. Get help right away if: You have chest pain. You have trouble breathing. You have discomfort or pain in your jaw, neck, back, shoulder, or arm. You have any symptoms of a stroke. "BE FAST" is an easy way to remember the main warning signs of a stroke: B - Balance. Signs are dizziness, sudden trouble walking, or loss of balance. E - Eyes. Signs  are trouble seeing or a sudden change in vision. F - Face. Signs are sudden weakness or numbness of the face, or the face or eyelid drooping on one side. A - Arms. Signs are weakness or numbness in an arm. This happens suddenly and usually on one side of the body. S - Speech. Signs are sudden trouble speaking, slurred speech, or trouble understanding what people say. T - Time. Time to call emergency services. Write down what time symptoms started. You have other signs of a stroke, such as: A sudden, severe headache with no known cause. Nausea or vomiting. Seizure. These symptoms may represent a serious problem that is an emergency. Do not wait to see if the symptoms will go away. Get medical help right away. Call your local emergency services (911 in the [place] Do not drive yourself to the hospital. Summary Cholesterol plaques increase your risk for heart attack and stroke. Work with your health care provider to keep your cholesterol levels in a healthy range. Eat a healthy, balanced diet, get regular exercise, and maintain a healthy weight. Do not use any products that contain nicotine or tobacco. These products include cigarettes, chewing tobacco, and vaping devices, such as e-cigarettes. Get help right away if you have any symptoms  of a stroke. This information is not intended to replace advice given to you by your health care provider. Make sure you discuss any questions you have with your health care provider. Document Revised: 01/13/2021 Document Reviewed: 01/03/2021 Elsevier Patient Education  2022 [place].   Coronary Calcium Scan A coronary calcium scan is an imaging test used to look for deposits of plaque in the inner lining of the blood vessels of the heart (coronary arteries). Plaque is made up of calcium, protein, and fatty substances. These deposits of plaque can partly clog and narrow the coronary arteries without producing any symptoms or warning signs. This puts a person  at risk for a heart attack. This test is recommended for people who are at moderate risk for heart disease. The test can find plaque deposits before symptoms develop. Tell a health care provider about: Any allergies you have. All medicines you are taking, including vitamins, herbs, eye drops, creams, and over-the-counter medicines. Any problems you or family members have had with anesthetic medicines. Any blood disorders you have. Any surgeries you have had. Any medical conditions you have. Whether you are pregnant or may be pregnant. What are the risks? Generally, this is a safe procedure. However, problems may occur, including: Harm to a pregnant woman and her unborn baby. This test involves the use of radiation. Radiation exposure can be dangerous to a pregnant woman and her unborn baby. If you are pregnant or think you may be pregnant, you should not have this procedure done. Slight increase in the risk of cancer. This is because of the radiation involved in the test. What happens before the procedure? Ask your health care provider for any specific instructions on how to prepare for this procedure. You may be asked to avoid products that contain caffeine, tobacco, or nicotine for 4 hours before the procedure. What happens during the procedure?  You will undress and remove any jewelry from your neck or chest. You will put on a hospital gown. Sticky electrodes will be placed on your chest. The electrodes will be connected to an electrocardiogram (ECG) machine to record a tracing of the electrical activity of your heart. You will lie down on a curved bed that is attached to the [place]. You may be given medicine to slow down your heart rate so that clear pictures can be created. You will be moved into the CT scanner, and the CT scanner will take pictures of your heart. During this time, you will be asked to lie still and hold your breath for 2-3 seconds at a time while each picture of your  heart is being taken. The procedure may vary among health care providers and hospitals. What happens after the procedure? You can get dressed. You can return to your normal activities. It is up to you to get the results of your procedure. Ask your health care provider, or the department that is doing the procedure, when your results will be ready. Summary A coronary calcium scan is an imaging test used to look for deposits of plaque in the inner lining of the blood vessels of the heart (coronary arteries). Plaque is made up of calcium, protein, and fatty substances. Generally, this is a safe procedure. Tell your health care provider if you are pregnant or may be pregnant. Ask your health care provider for any specific instructions on how to prepare for this procedure. A CT scanner will take pictures of your heart. You can return to your normal activities after  the scan is done. This information is not intended to replace advice given to you by your health care provider. Make sure you discuss any questions you have with your health care provider. Document Revised: 05/15/2019 Document Reviewed: 05/20/2019 Elsevier Patient Education  [place].

## 2021-09-26 DIAGNOSIS — E785 Hyperlipidemia, unspecified: Secondary | ICD-10-CM | POA: Insufficient documentation

## 2021-09-26 DIAGNOSIS — Z0000 Encounter for general adult medical examination without abnormal findings: Secondary | ICD-10-CM | POA: Insufficient documentation

## 2021-09-26 DIAGNOSIS — F17210 Nicotine dependence, cigarettes, uncomplicated: Secondary | ICD-10-CM | POA: Insufficient documentation

## 2021-09-29 DIAGNOSIS — G4733 Obstructive sleep apnea (adult) (pediatric): Secondary | ICD-10-CM | POA: Diagnosis not present

## 2021-10-08 DIAGNOSIS — G4733 Obstructive sleep apnea (adult) (pediatric): Secondary | ICD-10-CM | POA: Diagnosis not present

## 2021-11-01 DIAGNOSIS — G4733 Obstructive sleep apnea (adult) (pediatric): Secondary | ICD-10-CM | POA: Diagnosis not present

## 2021-11-13 DIAGNOSIS — M791 Myalgia, unspecified site: Secondary | ICD-10-CM | POA: Diagnosis not present

## 2021-11-13 DIAGNOSIS — Z20822 Contact with and (suspected) exposure to covid-19: Secondary | ICD-10-CM | POA: Diagnosis not present

## 2021-11-18 ENCOUNTER — Other Ambulatory Visit: Payer: Self-pay

## 2021-11-18 ENCOUNTER — Ambulatory Visit (INDEPENDENT_AMBULATORY_CARE_PROVIDER_SITE_OTHER): Payer: Medicare Other | Admitting: Internal Medicine

## 2021-11-18 ENCOUNTER — Ambulatory Visit
Admission: RE | Admit: 2021-11-18 | Discharge: 2021-11-18 | Disposition: A | Discharge status: Home / Self Care | Payer: Medicare Other | Source: Ambulatory Visit | Attending: Internal Medicine | Admitting: Internal Medicine

## 2021-11-18 ENCOUNTER — Telehealth: Payer: Self-pay | Admitting: Internal Medicine

## 2021-11-18 ENCOUNTER — Encounter: Payer: Self-pay | Admitting: Internal Medicine

## 2021-11-18 VITALS — BP 128/78 | HR 54 | Temp 98.0°F | Ht 68.5 in | Wt 168.2 lb

## 2021-11-18 DIAGNOSIS — R109 Unspecified abdominal pain: Secondary | ICD-10-CM | POA: Diagnosis not present

## 2021-11-18 DIAGNOSIS — J441 Chronic obstructive pulmonary disease with (acute) exacerbation: Secondary | ICD-10-CM | POA: Diagnosis not present

## 2021-11-18 DIAGNOSIS — R1084 Generalized abdominal pain: Secondary | ICD-10-CM

## 2021-11-18 DIAGNOSIS — E785 Hyperlipidemia, unspecified: Secondary | ICD-10-CM

## 2021-11-18 DIAGNOSIS — M47817 Spondylosis without myelopathy or radiculopathy, lumbosacral region: Secondary | ICD-10-CM | POA: Diagnosis not present

## 2021-11-18 DIAGNOSIS — I700 Atherosclerosis of aorta: Secondary | ICD-10-CM | POA: Diagnosis not present

## 2021-11-18 DIAGNOSIS — R10A1 Flank pain, right side: Secondary | ICD-10-CM

## 2021-11-18 DIAGNOSIS — M47816 Spondylosis without myelopathy or radiculopathy, lumbar region: Secondary | ICD-10-CM | POA: Diagnosis not present

## 2021-11-18 DIAGNOSIS — J101 Influenza due to other identified influenza virus with other respiratory manifestations: Secondary | ICD-10-CM | POA: Diagnosis not present

## 2021-11-18 MED ORDER — DM-GUAIFENESIN ER 60-1200 MG PO TB12: 1.0000 | ORAL_TABLET | Freq: Two times a day (BID) | ORAL | 0 refills | Status: DC

## 2021-11-18 MED ORDER — PREDNISONE 20 MG PO TABS: 40.0000 mg | ORAL_TABLET | Freq: Every day | ORAL | 0 refills | Status: DC

## 2021-11-18 MED ORDER — ALBUTEROL SULFATE HFA 108 (90 BASE) MCG/ACT IN AERS: 1.0000 | INHALATION_SPRAY | Freq: Four times a day (QID) | RESPIRATORY_TRACT | 0 refills | Status: DC | PRN

## 2021-11-18 MED ORDER — AZITHROMYCIN 250 MG PO TABS: ORAL_TABLET | ORAL | 0 refills | Status: AC

## 2021-11-18 NOTE — Telephone Encounter (Signed)
Lft pt vm on phone and on daughter vm to call ofc to sch STAT CT. thanks

## 2021-11-18 NOTE — Patient Instructions (Signed)
Finish tamiflu  Take zpack and prednisone  Try over the counter treatments below   If needing prescription strength medication we will need to make an appointment with a provider.  These are over the counter medication options:  Mucinex dm green label for cough or robitussin DM  Multivitamin or below vitamins  Vitamin C 1000 mg daily.  Vitamin D3 4000 Iu (units) daily.  Zinc 100 mg daily.  Quercetin 250-500 mg 2 times per day   Elderberry  Oil of oregano  cepacol or chloroseptic spray Warm salt water gargles +hydrogen peroxide Sugar free cough drops  Warm tea with honey and lemon  Hydration  Try to eat though you dont feel like it   Tylenol or Advil  Nasal saline and Flonase 2 sprays nasal congestion  If sneezing/runny nose over the counter allergy pill claritin,allegra, zyrtec, xyzal Quarantine x 10-14 days 14 days preferred   Monitor pulse oximeter, buy from [facility] if oxygen is less than 90 please go to the hospital.        Are you feeling really sick? Shortness of breath, cough, chest pain?, dizziness? Confusion   If so let me know  If worsening, go to hospital or [facility] clinic Urgent care for further treatment.      Influenza, Adult Influenza, also called "the flu," is a viral infection that mainly affects the respiratory tract. This includes the lungs, nose, and throat. The flu spreads easily from person to person (is contagious). It causes common cold symptoms, along with high fever and body aches. What are the causes? This condition is caused by the influenza virus. You can get the virus by: Breathing in droplets that are in the air from an infected person's cough or sneeze. Touching something that has the virus on it (has been contaminated) and then touching your mouth, nose, or eyes. What increases the risk? The following factors may make you more likely to get the flu: Not washing or sanitizing your hands often. Having close contact with many people during cold  and flu season. Touching your mouth, eyes, or nose without first washing or sanitizing your hands. Not getting an annual flu shot. You may have a higher risk for the flu, including serious problems, such as a lung infection (pneumonia), if you: Are older than 65. Are pregnant. Have a weakened disease-fighting system (immune system). This includes people who have HIV or AIDS, are on chemotherapy, or are taking medicines that reduce (suppress) the immune system. Have a long-term (chronic) illness, such as heart disease, kidney disease, diabetes, or lung disease. Have a liver disorder. Are severely overweight (morbidly obese). Have anemia. Have asthma. What are the signs or symptoms? Symptoms of this condition usually begin suddenly and last 4-14 days. These may include: Fever and chills. Headaches, body aches, or muscle aches. Sore throat. Cough. Runny or stuffy (congested) nose. Chest discomfort. Poor appetite. Weakness or fatigue. Dizziness. Nausea or vomiting. How is this diagnosed? This condition may be diagnosed based on: Your symptoms and medical history. A physical exam. Swabbing your nose or throat and testing the fluid for the influenza virus. How is this treated? If the flu is diagnosed early, you can be treated with antiviral medicine that is given by mouth (orally) or through an IV. This can help reduce how severe the illness is and how long it lasts. Taking care of yourself at home can help relieve symptoms. Your health care provider may recommend: Taking over-the-counter medicines. Drinking plenty of fluids. In many cases,  the flu goes away on its own. If you have severe symptoms or complications, you may be treated in a hospital. Follow these instructions at home: Activity Rest as needed and get plenty of sleep. Stay home from work or school as told by your health care provider. Unless you are visiting your health care provider, avoid leaving home until your fever  has been gone for 24 hours without taking medicine. Eating and drinking Take an oral rehydration solution (ORS). This is a drink that is sold at pharmacies and retail stores. Drink enough fluid to keep your urine pale yellow. Drink clear fluids in small amounts as you are able. Clear fluids include water, ice chips, fruit juice mixed with water, and low-calorie sports drinks. Eat bland, easy-to-digest foods in small amounts as you are able. These foods include bananas, applesauce, rice, lean meats, toast, and crackers. Avoid drinking fluids that contain a lot of sugar or caffeine, such as energy drinks, regular sports drinks, and soda. Avoid alcohol. Avoid spicy or fatty foods. General instructions   Take over-the-counter and prescription medicines only as told by your health care provider. Use a cool mist humidifier to add humidity to the air in your home. This can make it easier to breathe. When using a cool mist humidifier, clean it daily. Empty the water and replace it with clean water. Cover your mouth and nose when you cough or sneeze. Wash your hands with soap and water often and for at least 20 seconds, especially after you cough or sneeze. If soap and water are not available, use alcohol-based hand sanitizer. Keep all follow-up visits. This is important. How is this prevented?  Get an annual flu shot. This is usually available in late summer, fall, or winter. Ask your health care provider when you should get your flu shot. Avoid contact with people who are sick during cold and flu season. This is generally fall and winter. Contact a health care provider if: You develop new symptoms. You have: Chest pain. Diarrhea. A fever. Your cough gets worse. You produce more mucus. You feel nauseous or you vomit. Get help right away if you: Develop shortness of breath or have difficulty breathing. Have skin or nails that turn a bluish color. Have severe pain or stiffness in your  neck. Develop a sudden headache or sudden pain in your face or ear. Cannot eat or drink without vomiting. These symptoms may represent a serious problem that is an emergency. Do not wait to see if the symptoms will go away. Get medical help right away. Call your local emergency services (911 in the [place] Do not drive yourself to the hospital. Summary Influenza, also called "the flu," is a viral infection that primarily affects your respiratory tract. Symptoms of the flu usually begin suddenly and last 4-14 days. Getting an annual flu shot is the best way to prevent getting the flu. Stay home from work or school as told by your health care provider. Unless you are visiting your health care provider, avoid leaving home until your fever has been gone for 24 hours without taking medicine. Keep all follow-up visits. This is important. This information is not intended to replace advice given to you by your health care provider. Make sure you discuss any questions you have with your health care provider. Document Revised: 06/18/2020 Document Reviewed: 06/18/2020 Elsevier Patient Education  [place].

## 2021-11-18 NOTE — Telephone Encounter (Signed)
Pt called regarding a xray that someone supposed to have called him about before he went to the hospital to get it done

## 2021-11-18 NOTE — Telephone Encounter (Signed)
Patient returned referrals phone call to schedule STAT CT.
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
## 2021-11-30 ENCOUNTER — Other Ambulatory Visit: Payer: Self-pay | Admitting: *Deleted

## 2021-11-30 DIAGNOSIS — Z87891 Personal history of nicotine dependence: Secondary | ICD-10-CM

## 2021-11-30 DIAGNOSIS — F17210 Nicotine dependence, cigarettes, uncomplicated: Secondary | ICD-10-CM

## 2021-12-02 ENCOUNTER — Other Ambulatory Visit: Payer: Self-pay

## 2021-12-02 ENCOUNTER — Telehealth: Payer: Self-pay | Admitting: Pulmonary Disease

## 2021-12-02 ENCOUNTER — Ambulatory Visit
Admission: RE | Admit: 2021-12-02 | Discharge: 2021-12-02 | Disposition: A | Discharge status: Home / Self Care | Payer: Medicare Other | Source: Ambulatory Visit | Attending: Internal Medicine | Admitting: Internal Medicine

## 2021-12-02 DIAGNOSIS — F17210 Nicotine dependence, cigarettes, uncomplicated: Secondary | ICD-10-CM

## 2021-12-02 DIAGNOSIS — E785 Hyperlipidemia, unspecified: Secondary | ICD-10-CM | POA: Insufficient documentation

## 2021-12-02 DIAGNOSIS — Z87891 Personal history of nicotine dependence: Secondary | ICD-10-CM | POA: Insufficient documentation

## 2021-12-02 NOTE — Telephone Encounter (Signed)
Received a call report for patient's lung cancer screening CT. Below is the impression:    CLINICAL DATA:  Forty-two pack-year smoking history. Current smoker.   EXAM: CT CHEST WITHOUT CONTRAST LOW-DOSE FOR LUNG CANCER SCREENING   TECHNIQUE: Multidetector CT imaging of the chest was performed following the standard protocol without IV contrast.   RADIATION DOSE REDUCTION: This exam was performed according to the departmental dose-optimization program which includes automated exposure control, adjustment of the mA and/or kV according to patient size and/or use of iterative reconstruction technique.   COMPARISON:  05/31/2020   FINDINGS: Cardiovascular: Aortic atherosclerosis. Tortuous thoracic aorta. Mild cardiomegaly, without pericardial effusion.   Mediastinum/Nodes: No mediastinal or definite hilar adenopathy, given limitations of unenhanced CT.   Lungs/Pleura: No pleural fluid. Mild centrilobular emphysema. Bilateral pulmonary nodules of maximally volume derived equivalent diameter 4.9 mm are not significantly changed. There is an area of medial right apical soft tissue density including at volume derived equivalent diameter 8.8 mm on image 34/3 which is new since the prior exam, but possibly progressive pleuroparenchymal scarring.   Upper Abdomen: Normal imaged portions of the liver, spleen, stomach, pancreas, adrenal glands, kidneys.   Musculoskeletal: Mild bilateral gynecomastia. Accentuation of expected thoracic kyphosis.   IMPRESSION: 1. Lung-RADS 4A, suspicious. Follow up low-dose chest CT without contrast in 3 months (please use the following order, "CT CHEST LCS NODULE FOLLOW-UP W/O CM") is recommended. Alternatively, PET may be considered when there is a solid component 41mm or larger. Right apical soft tissue density is favored to represent progressive pleuroparenchymal scarring but is technically indeterminate. 2. Aortic atherosclerosis (ICD10-I70.0) and  emphysema (ICD10-J43.9). 3. Bilateral mild gynecomastia.     Electronically Signed   By: Abigail Miyamoto M.D.   On: 12/02/2021 11:10  Judson Roch, can you please advise? Thanks!

## 2021-12-06 ENCOUNTER — Other Ambulatory Visit: Payer: Self-pay

## 2021-12-06 ENCOUNTER — Telehealth: Payer: Self-pay | Admitting: Acute Care

## 2021-12-06 ENCOUNTER — Other Ambulatory Visit: Payer: Self-pay | Admitting: Internal Medicine

## 2021-12-06 DIAGNOSIS — E785 Hyperlipidemia, unspecified: Secondary | ICD-10-CM

## 2021-12-06 DIAGNOSIS — Z87891 Personal history of nicotine dependence: Secondary | ICD-10-CM

## 2021-12-06 DIAGNOSIS — F17210 Nicotine dependence, cigarettes, uncomplicated: Secondary | ICD-10-CM

## 2021-12-06 DIAGNOSIS — R911 Solitary pulmonary nodule: Secondary | ICD-10-CM

## 2021-12-06 MED ORDER — ATORVASTATIN CALCIUM 10 MG PO TABS: 10.0000 mg | ORAL_TABLET | Freq: Every day | ORAL | 3 refills | Status: DC

## 2021-12-06 NOTE — Telephone Encounter (Signed)
Results of CT chest and follow up plan info routed to PCP.  Order for 3 month follow up CT placed

## 2021-12-06 NOTE — Telephone Encounter (Signed)
I have called the patient with the results of his low-dose CT.  I explained that his scan was read as a lung RADS 4A, suspicious findings.  Recommendation is for a 31-month follow-up low-dose CT. I explained that there is an area of medial right apical soft tissue density including at volume derived equivalent diameter 8.8 mm on image 34/3 which is new since the prior exam, but possibly progressive pleuroparenchymal scarring. He is in agreement with a 3 month follow up low dose CT after 03/02/2022.  Denise, please place order for 3 month follow up low dose CT Chest and fax results to PCP. Thanks so much

## 2021-12-13 ENCOUNTER — Telehealth: Payer: Self-pay | Admitting: Pulmonary Disease

## 2021-12-13 NOTE — Telephone Encounter (Signed)
Will route to lung nodule pool. 

## 2021-12-14 NOTE — Telephone Encounter (Signed)
Spoke with pt and gave him an appt time for f/u CT for 02/13/22 10:00 at [facility]. Pt verbalized understanding.

## 2022-01-26 ENCOUNTER — Other Ambulatory Visit: Payer: Medicare Other

## 2022-02-13 ENCOUNTER — Other Ambulatory Visit: Payer: Self-pay

## 2022-02-13 ENCOUNTER — Ambulatory Visit
Admission: RE | Admit: 2022-02-13 | Discharge: 2022-02-13 | Disposition: A | Discharge status: Home / Self Care | Payer: Medicare Other | Source: Ambulatory Visit | Attending: Acute Care | Admitting: Acute Care

## 2022-02-13 DIAGNOSIS — R911 Solitary pulmonary nodule: Secondary | ICD-10-CM | POA: Insufficient documentation

## 2022-02-13 DIAGNOSIS — Z87891 Personal history of nicotine dependence: Secondary | ICD-10-CM | POA: Insufficient documentation

## 2022-02-13 DIAGNOSIS — F17210 Nicotine dependence, cigarettes, uncomplicated: Secondary | ICD-10-CM | POA: Diagnosis not present

## 2022-02-13 DIAGNOSIS — I700 Atherosclerosis of aorta: Secondary | ICD-10-CM | POA: Diagnosis not present

## 2022-02-27 ENCOUNTER — Other Ambulatory Visit: Payer: Self-pay | Admitting: *Deleted

## 2022-02-27 ENCOUNTER — Ambulatory Visit: Payer: Medicare Other | Admitting: Pulmonary Disease

## 2022-02-27 ENCOUNTER — Encounter: Payer: Self-pay | Admitting: Pulmonary Disease

## 2022-02-27 VITALS — BP 118/60 | HR 73 | Temp 97.8°F | Ht 68.5 in | Wt 169.4 lb

## 2022-02-27 DIAGNOSIS — R911 Solitary pulmonary nodule: Secondary | ICD-10-CM

## 2022-02-27 DIAGNOSIS — F17210 Nicotine dependence, cigarettes, uncomplicated: Secondary | ICD-10-CM

## 2022-02-27 DIAGNOSIS — G4733 Obstructive sleep apnea (adult) (pediatric): Secondary | ICD-10-CM | POA: Diagnosis not present

## 2022-02-27 DIAGNOSIS — Z122 Encounter for screening for malignant neoplasm of respiratory organs: Secondary | ICD-10-CM

## 2022-02-27 DIAGNOSIS — Z87891 Personal history of nicotine dependence: Secondary | ICD-10-CM

## 2022-02-27 NOTE — Patient Instructions (Signed)
Please use your CPAP as directed.  This is very important.  We will see you in follow-up in 3 months time. ? ?Please try to quit smoking. ? ?We are scheduling a follow-up scan of the chest in 6 months time. ?

## 2022-03-22 ENCOUNTER — Encounter: Payer: Self-pay | Admitting: Internal Medicine

## 2022-03-22 ENCOUNTER — Ambulatory Visit (INDEPENDENT_AMBULATORY_CARE_PROVIDER_SITE_OTHER): Payer: Medicare Other

## 2022-03-22 ENCOUNTER — Ambulatory Visit (INDEPENDENT_AMBULATORY_CARE_PROVIDER_SITE_OTHER): Payer: Medicare Other | Admitting: Internal Medicine

## 2022-03-22 VITALS — BP 132/82 | HR 54 | Temp 97.7°F | Resp 14 | Ht 68.5 in | Wt 171.0 lb

## 2022-03-22 DIAGNOSIS — M25562 Pain in left knee: Secondary | ICD-10-CM

## 2022-03-22 DIAGNOSIS — R739 Hyperglycemia, unspecified: Secondary | ICD-10-CM

## 2022-03-22 DIAGNOSIS — M25561 Pain in right knee: Secondary | ICD-10-CM

## 2022-03-22 DIAGNOSIS — I700 Atherosclerosis of aorta: Secondary | ICD-10-CM

## 2022-03-22 DIAGNOSIS — Z1329 Encounter for screening for other suspected endocrine disorder: Secondary | ICD-10-CM | POA: Diagnosis not present

## 2022-03-22 DIAGNOSIS — G8929 Other chronic pain: Secondary | ICD-10-CM

## 2022-03-22 DIAGNOSIS — N401 Enlarged prostate with lower urinary tract symptoms: Secondary | ICD-10-CM

## 2022-03-22 DIAGNOSIS — Z125 Encounter for screening for malignant neoplasm of prostate: Secondary | ICD-10-CM | POA: Diagnosis not present

## 2022-03-22 DIAGNOSIS — Z0000 Encounter for general adult medical examination without abnormal findings: Secondary | ICD-10-CM | POA: Diagnosis not present

## 2022-03-22 DIAGNOSIS — N529 Male erectile dysfunction, unspecified: Secondary | ICD-10-CM

## 2022-03-22 DIAGNOSIS — K219 Gastro-esophageal reflux disease without esophagitis: Secondary | ICD-10-CM | POA: Diagnosis not present

## 2022-03-22 DIAGNOSIS — M25362 Other instability, left knee: Secondary | ICD-10-CM | POA: Diagnosis not present

## 2022-03-22 DIAGNOSIS — E785 Hyperlipidemia, unspecified: Secondary | ICD-10-CM | POA: Diagnosis not present

## 2022-03-22 DIAGNOSIS — M545 Low back pain, unspecified: Secondary | ICD-10-CM

## 2022-03-22 DIAGNOSIS — E559 Vitamin D deficiency, unspecified: Secondary | ICD-10-CM | POA: Diagnosis not present

## 2022-03-22 LAB — COMPREHENSIVE METABOLIC PANEL
ALT: 7 U/L (ref 0–53)
AST: 13 U/L (ref 0–37)
Albumin: 3.9 g/dL (ref 3.5–5.2)
Alkaline Phosphatase: 71 U/L (ref 39–117)
BUN: 9 mg/dL (ref 6–23)
CO2: 30 mEq/L (ref 19–32)
Calcium: 8.7 mg/dL (ref 8.4–10.5)
Chloride: 104 mEq/L (ref 96–112)
Creatinine, Ser: 0.79 mg/dL (ref 0.40–1.50)
GFR: 88.87 mL/min (ref >60.00)
Glucose, Bld: 96 mg/dL (ref 70–99)
Potassium: 4.2 mEq/L (ref 3.5–5.1)
Sodium: 139 mEq/L (ref 135–145)
Total Bilirubin: 0.6 mg/dL (ref 0.2–1.2)
Total Protein: 6.7 g/dL (ref 6.0–8.3)

## 2022-03-22 LAB — LIPID PANEL
Cholesterol: 233 mg/dL — ABNORMAL HIGH (ref 0–200)
HDL: 74.9 mg/dL (ref >39.00)
LDL Cholesterol: 141 mg/dL — ABNORMAL HIGH (ref 0–99)
NonHDL: 158.54
Total CHOL/HDL Ratio: 3
Triglycerides: 86 mg/dL (ref 0.0–149.0)
VLDL: 17.2 mg/dL (ref 0.0–40.0)

## 2022-03-22 LAB — VITAMIN D 25 HYDROXY (VIT D DEFICIENCY, FRACTURES): VITD: 26.28 ng/mL — ABNORMAL LOW (ref 30.00–100.00)

## 2022-03-22 LAB — CBC WITH DIFFERENTIAL/PLATELET
Basophils Absolute: 0 10*3/uL (ref 0.0–0.1)
Basophils Relative: 0.5 % (ref 0.0–3.0)
Eosinophils Absolute: 0.1 10*3/uL (ref 0.0–0.7)
Eosinophils Relative: 2.3 % (ref 0.0–5.0)
HCT: 40.3 % (ref 39.0–52.0)
Hemoglobin: 13.6 g/dL (ref 13.0–17.0)
Lymphocytes Relative: 39.2 % (ref 12.0–46.0)
Lymphs Abs: 2 10*3/uL (ref 0.7–4.0)
MCHC: 33.8 g/dL (ref 30.0–36.0)
MCV: 98.5 fl (ref 78.0–100.0)
Monocytes Absolute: 0.5 10*3/uL (ref 0.1–1.0)
Monocytes Relative: 9.6 % (ref 3.0–12.0)
Neutro Abs: 2.5 10*3/uL (ref 1.4–7.7)
Neutrophils Relative %: 48.4 % (ref 43.0–77.0)
Platelets: 154 10*3/uL (ref 150.0–400.0)
RBC: 4.09 Mil/uL — ABNORMAL LOW (ref 4.22–5.81)
RDW: 14.7 % (ref 11.5–15.5)
WBC: 5.1 10*3/uL (ref 4.0–10.5)

## 2022-03-22 LAB — TSH: TSH: 1.82 u[IU]/mL (ref 0.35–5.50)

## 2022-03-22 LAB — HEMOGLOBIN A1C: Hgb A1c MFr Bld: 5.7 % (ref 4.6–6.5)

## 2022-03-22 LAB — PSA: PSA: 2.8 ng/mL (ref 0.10–4.00)

## 2022-03-22 MED ORDER — MELOXICAM 7.5 MG PO TABS: 7.5000 mg | ORAL_TABLET | Freq: Two times a day (BID) | ORAL | 11 refills | Status: DC | PRN

## 2022-03-22 MED ORDER — SILDENAFIL CITRATE 20 MG PO TABS: 20.0000 mg | ORAL_TABLET | Freq: Every day | ORAL | 5 refills | Status: DC | PRN

## 2022-03-22 MED ORDER — TAMSULOSIN HCL 0.4 MG PO CAPS: 0.4000 mg | ORAL_CAPSULE | Freq: Every day | ORAL | 3 refills | Status: DC

## 2022-03-22 MED ORDER — PANTOPRAZOLE SODIUM 40 MG PO TBEC: 40.0000 mg | DELAYED_RELEASE_TABLET | Freq: Every day | ORAL | 3 refills | Status: DC

## 2022-03-22 MED ORDER — CYCLOBENZAPRINE HCL 5 MG PO TABS: 2.5000 mg | ORAL_TABLET | Freq: Every evening | ORAL | 5 refills | Status: DC | PRN
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
## 2022-03-22 NOTE — Patient Instructions (Addendum)
Address: [address] [place], [place], [place] [address] ?Hours:  ?Open ? Closes 8-5?PM ?Phone: [phone] ? ?Call for appointment ? ? ? [facility]   ?[place]   ?[place], [place] [address]   ?[phone]   Ricard Dillon, MD   ?[phone] [address]   ?[place], [place] [address]   ?[phone] (Work)   ?[phone] (Fax)   Chronic left-sided low back pain with left-sided sciatica (Primary Dx)  ? ?[facility] ?Address: [address] [place], [place] [address] ?Hours:  ?Wednesday 10?AM-5:30?PM ?Thursday 10?AM-5:30?PM ?Friday 10?AM-4?PM ?Saturday Closed ?Sunday Closed ?Monday 10?AM-5:30?PM ?Tuesday 10?AM-2?PM ?Suggest new hours ? ? ? ?Phone: [phone] ? ?Benign Prostatic Hyperplasia ? ?Benign prostatic hyperplasia (BPH) is an enlarged prostate gland that is caused by the normal aging process. The prostate may get bigger as a man gets older. The condition is not caused by cancer. The prostate is a walnut-sized gland that is involved in the production of semen. It is located in front of the rectum and below the bladder. The bladder stores urine. The urethra carries stored urine out of the body. ?An enlarged prostate can press on the urethra. This can make it harder to pass urine. The buildup of urine in the bladder can cause infection. Back pressure and infection may progress to bladder damage and kidney (renal) failure. ?What are the causes? ?This condition is part of the normal aging process. However, not all men develop problems from this condition. If the prostate enlarges away from the urethra, urine flow will not be blocked. If it enlarges toward the urethra and compresses it, there will be problems passing urine. ?What increases the risk? ?This condition is more likely to develop in men older than 50 years. ?What are the signs or symptoms? ?Symptoms of this condition include: ?Getting up often during the night to urinate. ?Needing to urinate frequently during the day. ?Difficulty starting urine  flow. ?Decrease in size and strength of your urine stream. ?Leaking (dribbling) after urinating. ?Inability to pass urine. This needs immediate treatment. ?Inability to completely empty your bladder. ?Pain when you pass urine. This is more common if there is also an infection. ?Urinary tract infection (UTI). ?How is this diagnosed? ?This condition is diagnosed based on your medical history, a physical exam, and your symptoms. Tests will also be done, such as: ?A post-void bladder scan. This measures any amount of urine that may remain in your bladder after you finish urinating. ?A digital rectal exam. In a rectal exam, your health care provider checks your prostate by putting a lubricated, gloved finger into your rectum to feel the back of your prostate gland. This exam detects the size of your gland and any abnormal lumps or growths. ?An exam of your urine (urinalysis). ?A prostate specific antigen (PSA) screening. This is a blood test used to screen for prostate cancer. ?An ultrasound. This test uses sound waves to electronically produce a picture of your prostate gland. ?Your health care provider may refer you to a specialist in kidney and prostate diseases (urologist). ?How is this treated? ?Once symptoms begin, your health care provider will monitor your condition (active surveillance or watchful waiting). Treatment for this condition will depend on the severity of your condition. Treatment may include: ?Observation and yearly exams. This may be the only treatment needed if your condition and symptoms are mild. ?Medicines to relieve your symptoms, including: ?Medicines to shrink the prostate. ?Medicines to relax the muscle of the prostate. ?Surgery in severe cases.  Surgery may include: ?Prostatectomy. In this procedure, the prostate tissue is removed completely through an open incision or with a laparoscope or robotics. ?Transurethral resection of the prostate (TURP). In this procedure, a tool is inserted  through the opening at the tip of the penis (urethra). It is used to cut away tissue of the inner core of the prostate. The pieces are removed through the same opening of the penis. This removes the blockage. ?Transurethral incision (TUIP). In this procedure, small cuts are made in the prostate. This lessens the prostate's pressure on the urethra. ?Transurethral microwave thermotherapy (TUMT). This procedure uses microwaves to create heat. The heat destroys and removes a small amount of prostate tissue. ?Transurethral needle ablation (TUNA). This procedure uses radio frequencies to destroy and remove a small amount of prostate tissue. ?Interstitial laser coagulation (Gann). This procedure uses a laser to destroy and remove a small amount of prostate tissue. ?Transurethral electrovaporization (TUVP). This procedure uses electrodes to destroy and remove a small amount of prostate tissue. ?Prostatic urethral lift. This procedure inserts an implant to push the lobes of the prostate away from the urethra. ?Follow these instructions at home: ?Take over-the-counter and prescription medicines only as told by your health care provider. ?Monitor your symptoms for any changes. Contact your health care provider with any changes. ?Avoid drinking large amounts of liquid before going to bed or out in public. ?Avoid or reduce how much caffeine or alcohol you drink. ?Give yourself time when you urinate. ?Keep all follow-up visits. This is important. ?Contact a health care provider if: ?You have unexplained back pain. ?Your symptoms do not get better with treatment. ?You develop side effects from the medicine you are taking. ?Your urine becomes very dark or has a bad smell. ?Your lower abdomen becomes distended and you have trouble passing urine. ?Get help right away if: ?You have a fever or chills. ?You suddenly cannot urinate. ?You feel light-headed or very dizzy, or you faint. ?There are large amounts of blood or clots in your  urine. ?Your urinary problems become hard to manage. ?You develop moderate to severe low back or flank pain. The flank is the side of your body between the ribs and the hip. ?These symptoms may be an emergency. Get help right away. Call 911. ?Do not wait to see if the symptoms will go away. ?Do not drive yourself to the hospital. ?Summary ?Benign prostatic hyperplasia (BPH) is an enlarged prostate that is caused by the normal aging process. It is not caused by cancer. ?An enlarged prostate can press on the urethra. This can make it hard to pass urine. ?This condition is more likely to develop in men older than 50 years. ?Get help right away if you suddenly cannot urinate. ?This information is not intended to replace advice given to you by your health care provider. Make sure you discuss any questions you have with your health care provider. ?Document Revised: 05/18/2021 Document Reviewed: 05/18/2021 ?Elsevier Patient Education ? [place]. ? ?Tamsulosin Capsules ?What is this medication? ?TAMSULOSIN (tam SOO loe sin) treats the symptoms of an enlarged prostate (benign prostatic hyperplasia). It works by relaxing the muscles in the prostate and bladder, which makes it easier to urinate. It belongs to a group of medications called alpha blockers. ?This medicine may be used for other purposes; ask your health care provider or pharmacist if you have questions. ?COMMON BRAND NAME(S): Flomax ?What should I tell my care team before I take this medication? ?They need to know  if you have any of the following conditions: ?Advanced kidney disease ?Advanced liver disease ?Low blood pressure ?Prostate cancer ?An unusual or allergic reaction to tamsulosin, sulfa drugs, other medications, foods, dyes, or preservatives ?Pregnant or trying to get pregnant ?Breast-feeding ?How should I use this medication? ?Take this medication by mouth about 30 minutes after the same meal every day. Follow the directions on the prescription  label. Swallow the capsules whole with a glass of water. Do not crush, chew, or open capsules. Do not take your medication more often than directed. Do not stop taking your medication unless your care team tel

## 2022-03-23 ENCOUNTER — Telehealth: Payer: Self-pay

## 2022-03-23 LAB — URINALYSIS, ROUTINE W REFLEX MICROSCOPIC
Bilirubin Urine: NEGATIVE
Glucose, UA: NEGATIVE
Hgb urine dipstick: NEGATIVE
Ketones, ur: NEGATIVE
Leukocytes,Ua: NEGATIVE
Nitrite: NEGATIVE
Protein, ur: NEGATIVE
Specific Gravity, Urine: 1.012 (ref 1.001–1.035)
pH: 6.5 (ref 5.0–8.0)

## 2022-03-23 NOTE — Telephone Encounter (Signed)
Called pt to disc about lab results. Unable to reach nor lvm. ?

## 2022-03-24 ENCOUNTER — Other Ambulatory Visit: Payer: Self-pay | Admitting: Internal Medicine

## 2022-03-24 DIAGNOSIS — L309 Dermatitis, unspecified: Secondary | ICD-10-CM

## 2022-03-24 MED ORDER — TRIAMCINOLONE ACETONIDE 0.1 % EX CREA: 1.0000 "application " | TOPICAL_CREAM | Freq: Two times a day (BID) | CUTANEOUS | 0 refills | Status: DC

## 2022-03-24 NOTE — Telephone Encounter (Signed)
Informed pt that Dr.Tracy has sent in triamcinolone cream to [facility] to use 2x per day  ?If does not go away let me know  ?Use mild soap like dove and mild laundry detergents w/o much fragrance  ?Moisturize with cetaphil/cerave ?Pt verbalized understanding to all.  ?

## 2022-03-24 NOTE — Telephone Encounter (Signed)
Pt was seen last wk and had forgot to mention to you that he wanted a cream for his rash that he had in his R arm. He is requesting something for it. I didn't promise him that you would send something but I would inform you. ?

## 2022-03-24 NOTE — Telephone Encounter (Signed)
Sent triamcinolone cream to [facility] to use 2x per day  ?If does not go away let me know  ?Use mild soap like dove and mild laundry detergents w/o much fragrance  ?Moisturize with cetaphil/cerave ? ?

## 2022-03-24 NOTE — Telephone Encounter (Signed)
Patient called office back to see if the cream that him and Elita Quick talked about this morning has been filled. ?

## 2022-05-01 ENCOUNTER — Telehealth: Payer: Self-pay | Admitting: Internal Medicine

## 2022-05-01 NOTE — Telephone Encounter (Signed)
Pt called in requesting for the servicing number for the cpap machine... Pt stated that he needs result/reading for the cpap for work... Pt requesting callback.Marland KitchenMarland Kitchen

## 2022-05-05 ENCOUNTER — Telehealth: Payer: Self-pay | Admitting: Cardiovascular Disease

## 2022-05-08 NOTE — Telephone Encounter (Signed)
Pt calling back to f/u on call from 6/23. Pt would like for nurse to return call as soon as possible. Please advise

## 2022-06-05 ENCOUNTER — Encounter: Payer: Self-pay | Admitting: Pulmonary Disease

## 2022-06-05 ENCOUNTER — Ambulatory Visit: Payer: Medicare Other | Admitting: Pulmonary Disease

## 2022-06-05 VITALS — BP 124/60 | HR 74 | Temp 98.0°F | Ht 68.5 in | Wt 165.0 lb

## 2022-06-05 DIAGNOSIS — G4733 Obstructive sleep apnea (adult) (pediatric): Secondary | ICD-10-CM | POA: Diagnosis not present
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
## 2022-06-05 NOTE — Patient Instructions (Addendum)
According to your report today from your machine you have been using it as prescribed.  So you are good as far as the use of your machine.  We have sent an order to [facility] so that they can replace or fix your machine.   You have an upcoming appointment with Dr. Halford Chessman (you are on a recall list so they will call you with the appointment) this will be around October.

## 2022-08-15 ENCOUNTER — Telehealth: Payer: Self-pay | Admitting: Internal Medicine

## 2022-08-15 NOTE — Telephone Encounter (Signed)
Copied from [place] [id number]. Topic: Medicare AWV >> Aug 15, 2022 10:11 AM Devoria Glassing wrote: Reason for CRM: Left message for patient to schedule Annual Wellness Visit.  Please schedule with Nurse Health Advisor Denisa O'Brien-Blaney, LPN at [facility]. This appt can be telephone or office visit.  Please call [phone] ask for [facility]

## 2022-08-25 ENCOUNTER — Telehealth: Payer: Self-pay | Admitting: *Deleted

## 2022-08-25 NOTE — Telephone Encounter (Signed)
Left message on voicemail to call and schedule follow up LCS CT scan.

## 2022-08-31 ENCOUNTER — Telehealth: Payer: Self-pay | Admitting: Internal Medicine

## 2022-08-31 NOTE — Telephone Encounter (Signed)
Copied from [place] [id number] [phone]. Topic: Medicare AWV >> Aug 31, 2022  1:55 PM Devoria Glassing wrote: Reason for CRM: Left message for patient to schedule Annual Wellness Visit.  Please schedule with Nurse Health Advisor Denisa O'Brien-Blaney, LPN at [facility]. This appt can be telephone or office visit.  Please call [phone] ask for [facility]

## 2022-09-01 ENCOUNTER — Telehealth: Payer: Self-pay | Admitting: Pulmonary Disease

## 2022-09-01 NOTE — Telephone Encounter (Signed)
I have left a message asking the patient to call me to reschedule his CT

## 2022-09-05 ENCOUNTER — Ambulatory Visit: Admission: RE | Admit: 2022-09-05 | Payer: Medicare Other | Source: Ambulatory Visit

## 2022-09-07 ENCOUNTER — Ambulatory Visit: Payer: Medicare Other | Admitting: Pulmonary Disease

## 2022-09-07 ENCOUNTER — Encounter: Payer: Self-pay | Admitting: Pulmonary Disease

## 2022-09-07 VITALS — BP 124/78 | HR 78 | Temp 97.8°F | Ht 68.5 in | Wt 170.4 lb

## 2022-09-07 DIAGNOSIS — G4733 Obstructive sleep apnea (adult) (pediatric): Secondary | ICD-10-CM

## 2022-09-07 DIAGNOSIS — F17210 Nicotine dependence, cigarettes, uncomplicated: Secondary | ICD-10-CM | POA: Diagnosis not present

## 2022-09-07 NOTE — Patient Instructions (Signed)
Follow up in 1 year.

## 2022-09-07 NOTE — Progress Notes (Addendum)
[facility]  Chief Complaint  Patient presents with   Follow-up    Wearing cpap avg 3-4hr. Pressure and mask is okay.     Constitutional:  BP 124/78 (BP Location: Left Arm, Cuff Size: Normal)   Pulse 78   Temp 97.8 F (36.6 C) (Temporal)   Ht 5' 8.5" (1.74 m)   Wt 170 lb 6.4 oz (77.3 kg)   SpO2 99%   BMI 25.53 kg/m   Past Medical History:  OA, GERD  Past Surgical History:  He  has a past surgical history that includes No past surgeries and Colonoscopy (02/09/2020).  Brief Summary:  Juan [name]. is a 71 y.o. male smoker with obstructive sleep apnea and emphysema.  He is a Proofreader.      Subjective:   He is doing well with CPAP.  No issues with mask fit.  This is helping.  He sleeps about 5 to 6 hours per day.  Breathing okay.  Not having cough, wheeze, or sputum.  Still smoking.  He is planning to quit again.  Physical Exam:   Appearance - well kempt   ENMT - no sinus tenderness, no oral exudate, no LAN, Mallampati 2 airway, no stridor  Respiratory - equal breath sounds bilaterally, no wheezing or rales  CV - s1s2 regular rate and rhythm, no murmurs  Ext - no clubbing, no edema  Skin - no rashes  Psych - normal mood and affect    Pulmonary testing:  PFT 11/09/20 >> FEV1 1.83 (68%), FEV1% 77, TLC 4.50 (67%), DLCO 77%  Chest Imaging:  CT chest 06/01/20 >> few scattered nodules up to 3 mm, mild centrilobular and paraseptal emphysema  Sleep Tests:  PSG 12/22/20 >> AHI 10.4, SpO2 low 87%, REM AHI 51.8 CPAP 08/07/22 to 09/05/22 >> used on 25 of 30 nights with average 4 hrs 54 min.  Average AHI 0.3 with CPAP 9 cm H2O  Cardiac Tests:  Echo 07/12/20 >> EF 60 to 65%, mild LVH, RVSP 38 mmHg  Social History:  He  reports that he has been smoking cigarettes. He has a 39.75 pack-year smoking history. He has never used smokeless tobacco. He reports current alcohol use. He reports that he does not use  drugs.  Family History:  His family history includes Diabetes in his brother, brother, and mother; Iron deficiency in his mother.     Assessment/Plan:   Obstructive sleep apnea. - he is compliant with CPAP and reports benefit from therapy - he uses [facility] for his DME - his current CPAP was ordered February 2022 - continue CPAP 9 cm H2O - discuss role for humidifier with his CPAP  Tobacco abuse with changes of mild emphysema on CT chest. - discussed options to assist with smoking cessation  Time Spent Involved in Patient Care on Day of Examination:  27 minutes  Follow up:   Patient Instructions  Follow up in 1 year  Medication List:   Allergies as of 09/07/2022   No Known Allergies      Medication List        Accurate as of September 07, 2022 12:09 PM. If you have any questions, ask your nurse or doctor.          albuterol 108 (90 Base) MCG/ACT inhaler Commonly known as: VENTOLIN HFA Inhale 1-2 puffs into the lungs every 6 (six) hours as needed for wheezing or shortness of breath.   atorvastatin 10 MG tablet Commonly known as:  LIPITOR Take 1 tablet (10 mg total) by mouth at bedtime.   cyclobenzaprine 5 MG tablet Commonly known as: FLEXERIL Take 0.5-1 tablets (2.5-5 mg total) by mouth at bedtime as needed for muscle spasms.   Dextromethorphan-Guaifenesin 60-1200 MG 12hr tablet Take 1 tablet by mouth every 12 (twelve) hours.   FISH OIL PO Take by mouth.   IRON PO Take by mouth.   meloxicam 7.5 MG tablet Commonly known as: MOBIC Take 1 tablet (7.5 mg total) by mouth 2 (two) times daily as needed for pain. Joint pain (back/knees)   pantoprazole 40 MG tablet Commonly known as: Protonix Take 1 tablet (40 mg total) by mouth daily. For heartburn 30 minutes before food   sildenafil 20 MG tablet Commonly known as: REVATIO Take 1-5 tablets (20-100 mg total) by mouth daily as needed. For erectile dysfunction daily as needed   tamsulosin 0.4 MG Caps  capsule Commonly known as: FLOMAX Take 1 capsule (0.4 mg total) by mouth daily after supper.   triamcinolone cream 0.1 % Commonly known as: KENALOG Apply 1 application. topically 2 (two) times daily. Arm rash   VITAMIN D PO Take by mouth.        Signature:  Chesley Mires, MD [place] Pager - [phone] 09/07/2022, 12:09 PM

## 2022-09-18 ENCOUNTER — Ambulatory Visit
Admission: RE | Admit: 2022-09-18 | Discharge: 2022-09-18 | Disposition: A | Discharge status: Home / Self Care | Payer: Medicare Other | Source: Ambulatory Visit | Attending: Acute Care | Admitting: Acute Care

## 2022-09-18 DIAGNOSIS — Z87891 Personal history of nicotine dependence: Secondary | ICD-10-CM | POA: Diagnosis not present

## 2022-09-18 DIAGNOSIS — F17210 Nicotine dependence, cigarettes, uncomplicated: Secondary | ICD-10-CM | POA: Insufficient documentation

## 2022-09-18 DIAGNOSIS — R911 Solitary pulmonary nodule: Secondary | ICD-10-CM | POA: Insufficient documentation

## 2022-09-18 DIAGNOSIS — J439 Emphysema, unspecified: Secondary | ICD-10-CM | POA: Diagnosis not present

## 2022-09-18 DIAGNOSIS — Z122 Encounter for screening for malignant neoplasm of respiratory organs: Secondary | ICD-10-CM | POA: Diagnosis not present

## 2022-09-18 DIAGNOSIS — R918 Other nonspecific abnormal finding of lung field: Secondary | ICD-10-CM | POA: Diagnosis not present

## 2022-09-19 ENCOUNTER — Telehealth: Payer: Self-pay | Admitting: Family Medicine

## 2022-09-19 NOTE — Telephone Encounter (Signed)
Copied from [place] [phone]. Topic: Medicare AWV >> Sep 19, 2022  9:26 AM Devoria Glassing wrote: Reason for CRM: Called patient back to schedule his AWV.  He is due until 11/20/22  after he sees his new provider Carollee Leitz.  Call Juliann Pulse at [phone] if more information is needed

## 2022-09-21 ENCOUNTER — Telehealth: Payer: Self-pay | Admitting: Acute Care

## 2022-09-21 NOTE — Telephone Encounter (Signed)
Left VM for patient to call to review results of recent LDCT.  Eric Form, NP recommends a 6 months follow up CT for nodule monitoring.  Though the results are RADS2, she recommends not waiting a year to look at the nodule size again

## 2022-09-22 ENCOUNTER — Ambulatory Visit: Payer: Medicare Other | Admitting: Internal Medicine

## 2022-09-28 ENCOUNTER — Telehealth: Payer: Self-pay | Admitting: Acute Care

## 2022-09-28 NOTE — Telephone Encounter (Signed)
Left another VM for pt to call back to discuss lung screening CT results.

## 2022-09-28 NOTE — Telephone Encounter (Signed)
I have reviewed the scan. LR 2, OK to send a letter for 12 month annual screening, order annual scan and fax results to PCP. Thanks so much

## 2022-09-28 NOTE — Telephone Encounter (Signed)
This has been reviewed with Eric Form, NP and she clarified that she would like the CT scan to be repeated in 6 months instead of 12 months. We have left messages and also mailed a letter to pt to have him call our office to discuss these results.

## 2022-10-03 NOTE — Telephone Encounter (Signed)
Left message for pt's daughter, Vernell Barrier [facility]) to call to discuss pt's CT results.

## 2022-10-10 ENCOUNTER — Telehealth: Payer: Self-pay | Admitting: Acute Care

## 2022-10-11 ENCOUNTER — Other Ambulatory Visit: Payer: Self-pay

## 2022-10-11 DIAGNOSIS — Z87891 Personal history of nicotine dependence: Secondary | ICD-10-CM

## 2022-10-11 DIAGNOSIS — F17210 Nicotine dependence, cigarettes, uncomplicated: Secondary | ICD-10-CM

## 2022-10-11 DIAGNOSIS — R911 Solitary pulmonary nodule: Secondary | ICD-10-CM

## 2022-10-11 NOTE — Telephone Encounter (Signed)
Spoke with patient by phone to review results of LDCT.   Radiologist recommendation was for 12 month follow up CT but due to some changes in lung nodule size, the pulmonology provider, Eric Form, NP, recommended a 6 month follow up to check the nodules once more before returning to the 1 year LDCT.  Patient is in agreement and acknowledged understanding.  New order placed and results to PCP with plan.

## 2022-10-11 NOTE — Telephone Encounter (Signed)
Patient left VM about results.  Called back but no answer.  Left VM.  Will try again.

## 2022-10-11 NOTE — Telephone Encounter (Signed)
Reviewed results with Eric Form, NP and she recommends a 6 mth follow up to assess nodules seen.  Left message for pt to call back to discuss results.

## 2022-11-20 ENCOUNTER — Encounter: Payer: Medicare Other | Admitting: Family Medicine

## 2022-12-08 ENCOUNTER — Encounter: Payer: Self-pay | Admitting: Pulmonary Disease

## 2023-01-29 IMAGING — MR MR LUMBAR SPINE W/O CM
5 series · 30 of 48 positions shown · non-contrast
Comparison: Plain films September 20, 2018

CLINICAL DATA: Lumbar radiculopathy.

EXAM:
MRI LUMBAR SPINE WITHOUT CONTRAST
TECHNIQUE: Multiplanar, multisequence MR imaging of the lumbar spine was
performed. No intravenous contrast was administered.

[Series 5: T2 · sagittal · 4.0mm · 0.81mm/px · 6 of 17 slices shown (1 of 2)]
[im 1/17]
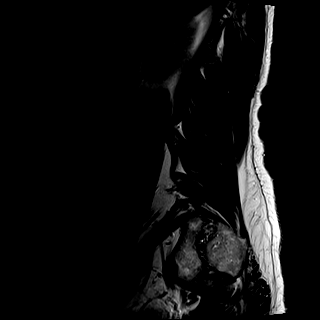
[im 4/17]
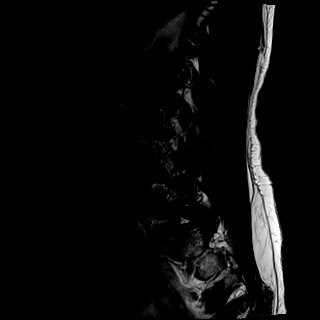
[im 7/17]
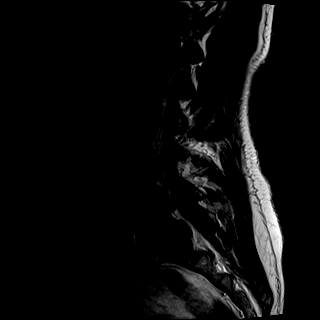
[im 10/17]
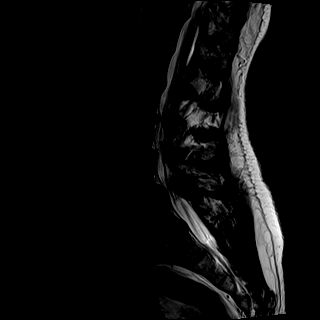
[im 13/17]
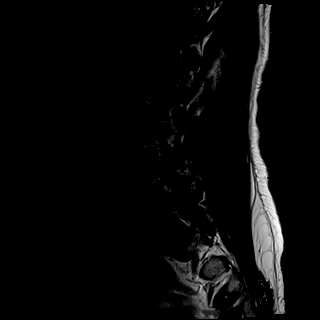
[im 17/17]
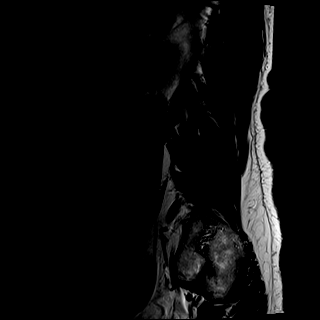

[Series 6: T1 · sagittal · 4.0mm · 0.81mm/px · 7 of 17 slices shown (1 of 2)]
[im 1/17]
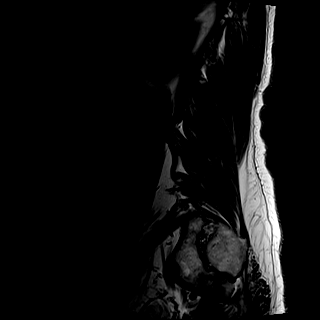
[im 3/17]
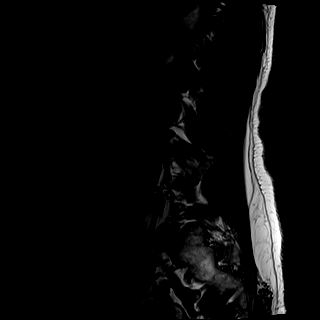
[im 6/17]
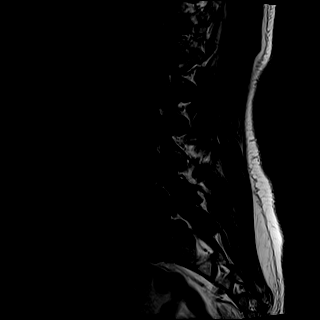
[im 9/17]
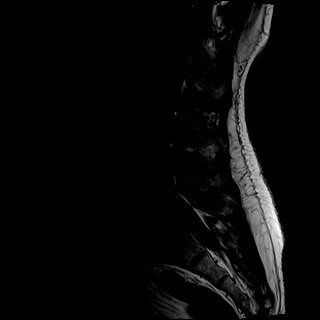
[im 11/17]
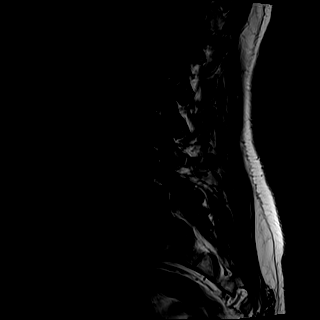
[im 14/17]
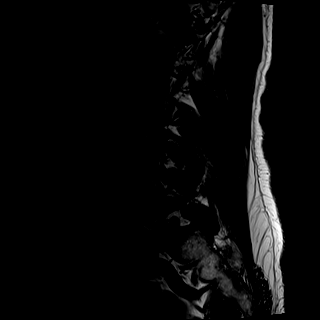
[im 17/17]
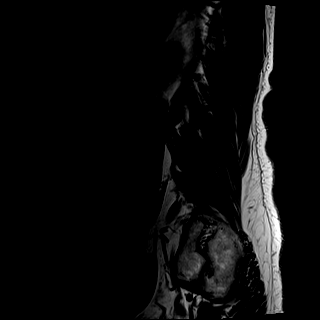

[Series 7: STIR · sagittal · 4.0mm · 0.41mm/px · 1 of 17 slices shown]
[im 1/17]
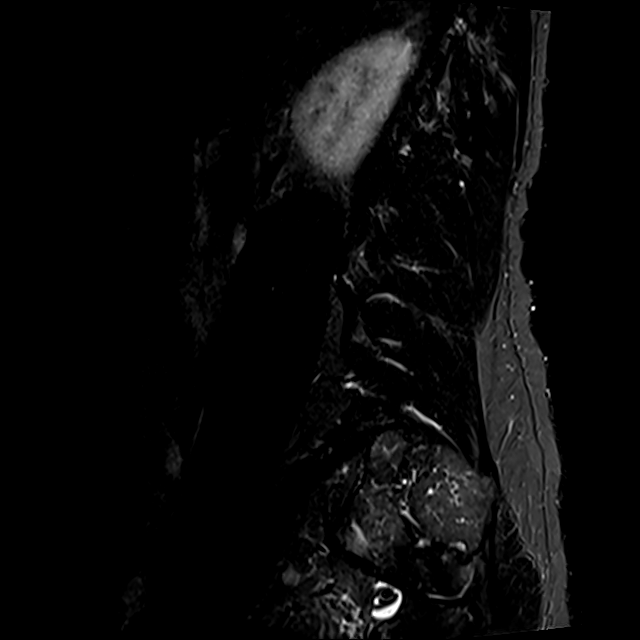

[Series 8: T2 · axial · 4.0mm · 0.78mm/px · z∈[-66,+142]mm · 8 of 36 slices shown (2 of 2)]
[im 1/36]
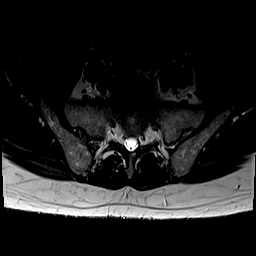
[im 6/36]
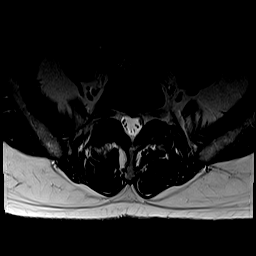
[im 11/36]
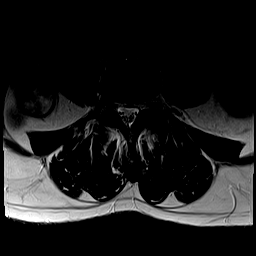
[im 17/36]
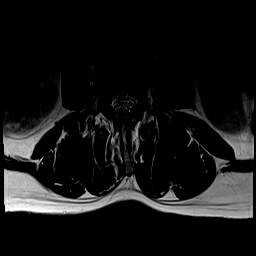
[im 19/36]
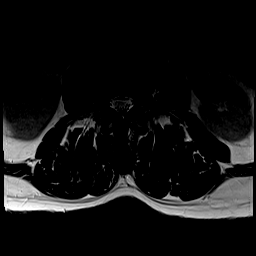
[im 25/36]
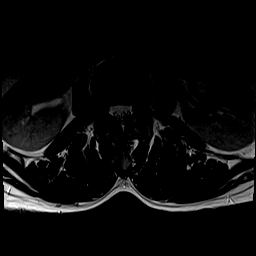
[im 30/36]
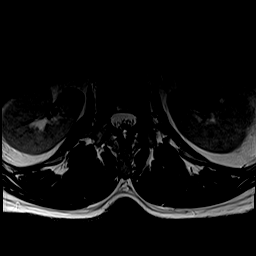
[im 36/36]
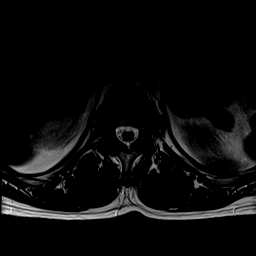

[Series 9: T1 · axial · 4.0mm · 0.39mm/px · z∈[-66,+142]mm · 8 of 36 slices shown (2 of 2)]
[im 1/36]
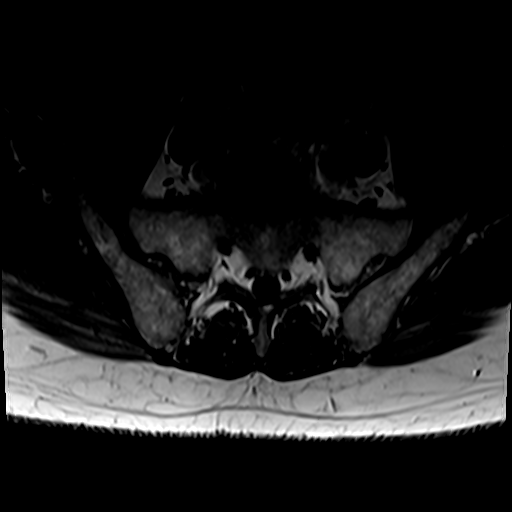
[im 6/36]
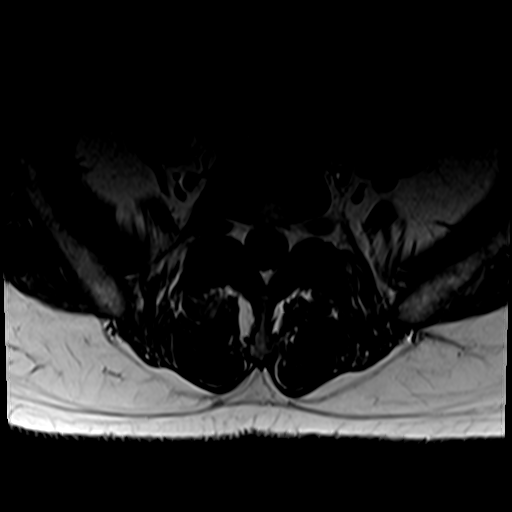
[im 11/36]
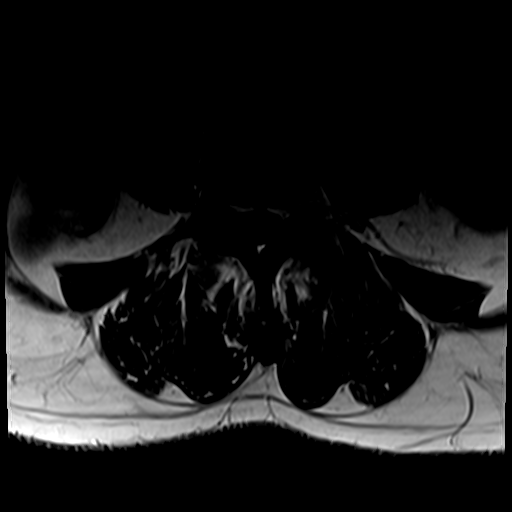
[im 17/36]
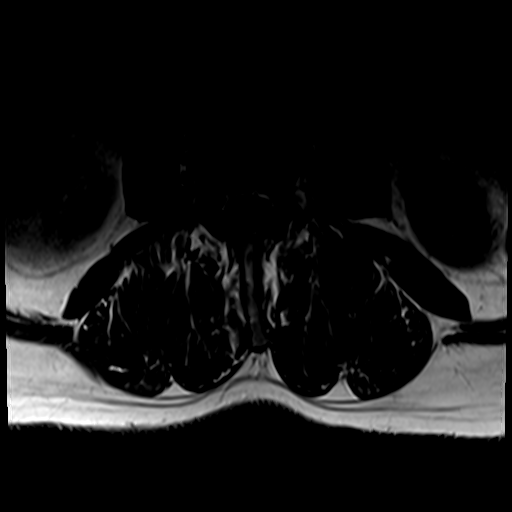
[im 19/36]
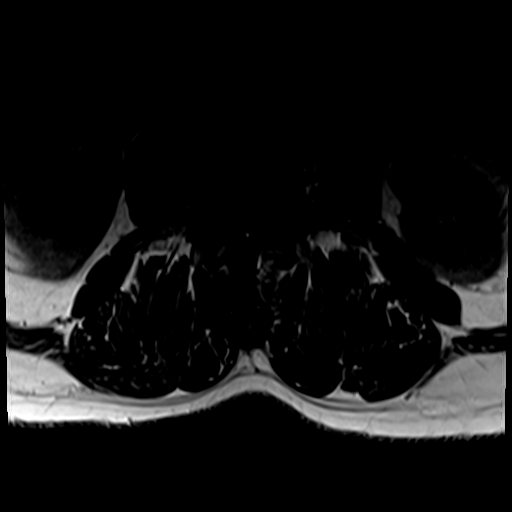
[im 25/36]
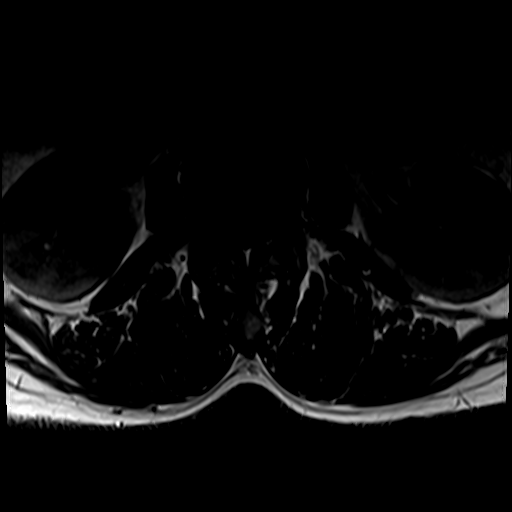
[im 30/36]
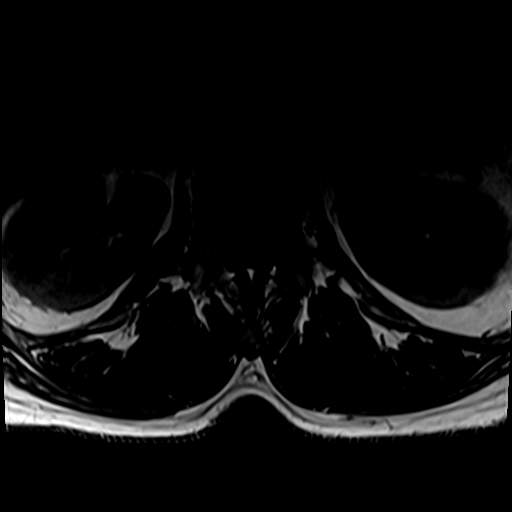
[im 36/36]
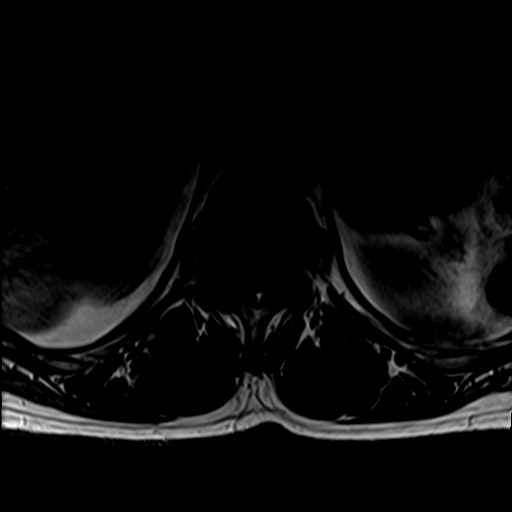

[30 of 48 positions shown; findings below may reference images not displayed]

FINDINGS: Segmentation:  Standard.

Alignment: Dextroconvex scoliosis of the lumbar spine. Minimal
anterolisthesis of L4 over L5.

Vertebrae:  No fracture, evidence of discitis, or bone lesion.

Conus medullaris and cauda equina: Conus extends to the L1-2 level.
Conus and cauda equina appear normal.

Paraspinal and other soft tissues: Left renal cyst

Disc levels:

T12-L1: No spinal canal or neural foraminal stenosis.

L1-2: Shallow disc bulge and mild facet degenerative changes without
significant spinal canal or neural foraminal stenosis.

L2-3: Mild loss of disc height, disc bulge with superimposed small
left central disc protrusion, mild facet degenerative changes and
ligamentum flavum redundancy resulting in mild spinal canal stenosis
with narrowing of the bilateral subarticular zones, left greater
than right, mild right and mild-to-moderate left neural foraminal
narrowing.

L3-4: Mild loss of disc height, disc bulge, moderate facet
degenerative changes and ligamentum flavum redundancy resulting in
mild bilateral neural foraminal narrowing. No significant spinal
canal stenosis.

L4-5: Disc bulge mildly asymmetric to the left, advanced facet
degenerative changes and ligamentum flavum redundancy resulting in
mild-to-moderate spinal canal stenosis with narrowing of the
bilateral subarticular zones and mild-to-moderate left neural
foraminal narrowing.

L5-S1: Shallow disc bulge with right far lateral disc osteophyte
complex and moderate facet degenerative changes without significant
spinal canal or neural foraminal stenosis.
IMPRESSION: 1. Multilevel degenerative changes of the lumbar spine as described
above, worst at L4-5 where there is mild-to-moderate spinal canal
stenosis with narrowing of the bilateral subarticular zones and
mild-to-moderate left neural foraminal narrowing.
2. Mild spinal canal stenosis at L2-L3 with narrowing of the
bilateral subarticular zones, left greater than right, which could
affect the traversing left L3 nerve roots.

## 2023-02-24 ENCOUNTER — Encounter: Payer: Self-pay | Admitting: Pulmonary Disease
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
## 2023-03-12 ENCOUNTER — Ambulatory Visit (INDEPENDENT_AMBULATORY_CARE_PROVIDER_SITE_OTHER): Payer: Medicare Other | Admitting: Family

## 2023-03-12 ENCOUNTER — Ambulatory Visit
Admission: RE | Admit: 2023-03-12 | Discharge: 2023-03-12 | Disposition: A | Discharge status: Home / Self Care | Payer: Medicare Other | Source: Ambulatory Visit | Attending: Family | Admitting: Family

## 2023-03-12 VITALS — BP 132/76 | HR 76 | Temp 97.9°F | Ht 68.0 in | Wt 163.4 lb

## 2023-03-12 DIAGNOSIS — R7309 Other abnormal glucose: Secondary | ICD-10-CM

## 2023-03-12 DIAGNOSIS — I10 Essential (primary) hypertension: Secondary | ICD-10-CM | POA: Diagnosis not present

## 2023-03-12 DIAGNOSIS — M7989 Other specified soft tissue disorders: Secondary | ICD-10-CM | POA: Diagnosis not present

## 2023-03-12 LAB — POCT GLYCOSYLATED HEMOGLOBIN (HGB A1C): Hemoglobin A1C: 5.9 % — AB (ref 4.0–5.6)

## 2023-03-12 NOTE — Progress Notes (Signed)
Assessment & Plan:  Left leg swelling Assessment & Plan: Concern for PAD.  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  Provided Ace wrap.  Advised to use capsaicin over-the-counter for numbness.  Counseled on low-sodium diet, elevation A1c today 5.9.   Orders: -     US ARTERIAL ABI (SCREENING LOWER EXTREMITY); Future -     US Venous Img Lower Unilateral Left (DVT); Future  Hypertension, unspecified type -     POCT glycosylated hemoglobin (Hb A1C)     Return precautions given.   Risks, benefits, and alternatives of the medications and treatment plan prescribed today were discussed, and patient expressed understanding.   Education regarding symptom management and diagnosis given to patient on AVS either electronically or printed.  Return for [employer] upcoming or due, schedule.  Rennie Plowman, FNP  Subjective:    Patient ID: Juan [name]., male    DOB: 09/21/1951, 72 y.o.   MRN: [id number]  CC: Juan [name]. is a 72 y.o. male who presents today for an acute visit.    HPI: Complains of left ankle numbness and swelling x 4 weeks, episodic.   First he noticed 'numbness like a rubber band '  one evening prior to bedtime, resolved that night and then he another episode 3 days later.   No injury.   Endorses salt indiscretion.   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   Denies orthopnea, sob, cough, intermittent claudication.  Occasional low back pain long period of sitting, driving. Endorses stiffness        History of pulmonary artery hypertension, atherosclerosis, COPD, OSA, tobacco  Echocardiogram 07/12/2020 ejection fraction 60 to 65%.  Mild left ventricular hypertrophy.  Mildly elevated pulmonary artery systolic pressure.  Allergies: Patient has no known allergies. Current Outpatient Medications on File Prior to Visit   Medication Sig Dispense Refill   albuterol (VENTOLIN HFA) 108 (90 Base) MCG/ACT inhaler Inhale 1-2 puffs into the lungs every 6 (six) hours as needed for wheezing or shortness of breath. 18 g 0   atorvastatin (LIPITOR) 10 MG tablet Take 1 tablet (10 mg total) by mouth at bedtime. 90 tablet 3   cyclobenzaprine (FLEXERIL) 5 MG tablet Take 0.5-1 tablets (2.5-5 mg total) by mouth at bedtime as needed for muscle spasms. 30 tablet 5   Dextromethorphan-Guaifenesin 60-1200 MG 12hr tablet Take 1 tablet by mouth every 12 (twelve) hours. 30 tablet 0   Ferrous Sulfate (IRON PO) Take by mouth.     meloxicam (MOBIC) 7.5 MG tablet Take 1 tablet (7.5 mg total) by mouth 2 (two) times daily as needed for pain. Joint pain (back/knees) 60 tablet 11   Omega-3 Fatty Acids (FISH OIL PO) Take by mouth.     pantoprazole (PROTONIX) 40 MG tablet Take 1 tablet (40 mg total) by mouth daily. For heartburn 30 minutes before food 90 tablet 3   sildenafil (REVATIO) 20 MG tablet Take 1-5 tablets (20-100 mg total) by mouth daily as needed. For erectile dysfunction daily as needed 30 tablet 5   tamsulosin (FLOMAX) 0.4 MG CAPS capsule Take 1 capsule (0.4 mg total) by mouth daily after supper. 90 capsule 3   triamcinolone cream (KENALOG) 0.1 % Apply 1 application. topically 2 (two) times daily. Arm rash 60 g 0   VITAMIN D PO Take by mouth.  No current facility-administered medications on file prior to visit.    Review of Systems  Constitutional:  Negative for chills and fever.  Respiratory:  Negative for cough and shortness of breath.   Cardiovascular:  Positive for leg swelling. Negative for chest pain and palpitations.  Gastrointestinal:  Negative for nausea and vomiting.  Musculoskeletal:  Negative for back pain (occassional, none today).  Neurological:  Positive for numbness.      Objective:    BP 132/76   Pulse 76   Temp 97.9 F (36.6 C) (Oral)   Ht 5\' 8"  (1.727 m)   Wt 163 lb 6.4 oz (74.1 kg)   SpO2 97%    BMI 24.84 kg/m   BP Readings from Last 3 Encounters:  03/12/23 132/76  09/07/22 124/78  06/05/22 124/60   Wt Readings from Last 3 Encounters:  03/12/23 163 lb 6.4 oz (74.1 kg)  09/07/22 170 lb 6.4 oz (77.3 kg)  06/05/22 165 lb (74.8 kg)    Physical Exam Vitals reviewed.  Constitutional:      Appearance: He is well-developed.  Cardiovascular:     Rate and Rhythm: Regular rhythm.     Heart sounds: Normal heart sounds.     Comments: LLE non pitting ankle edema.  No palpable cords or masses. No erythema or increased warmth. No asymmetry in calf size when compared bilaterally LE hair growth symmetric and present. No discoloration or varicosities noted. LE warm . Diminished pedal pulses.  Pulmonary:     Effort: Pulmonary effort is normal. No respiratory distress.     Breath sounds: Normal breath sounds. No wheezing, rhonchi or rales.  Musculoskeletal:     Left lower leg: Edema present.  Skin:    General: Skin is warm and dry.  Neurological:     Mental Status: He is alert.  Psychiatric:        Speech: Speech normal.        Behavior: Behavior normal.

## 2023-03-12 NOTE — Assessment & Plan Note (Signed)
Concern for PAD.  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  Provided Ace wrap.  Advised to use capsaicin over-the-counter for numbness.  Counseled on low-sodium diet, elevation A1c today 5.9.

## 2023-03-12 NOTE — Patient Instructions (Addendum)
I provided you with an Ace wrap today to use on your left foot.  This will help reduce swelling.  You may also elevate your leg at bedtime by propping it on pillows.  Please decrease salt in your diet and adding salt to foods.  In its place you may try versions of Dash seasoning.   I have ordered two ultrasounds of your legs     DASH Eating Plan DASH stands for Dietary Approaches to Stop Hypertension. The DASH eating plan is a healthy eating plan that has been shown to: Reduce high blood pressure (hypertension). Reduce your risk for type 2 diabetes, heart disease, and stroke. Help with weight loss. What are tips for following this plan? Reading food labels Check food labels for the amount of salt (sodium) per serving. Choose foods with less than 5 percent of the Daily Value of sodium. Generally, foods with less than 300 milligrams (mg) of sodium per serving fit into this eating plan. To find whole grains, look for the word "whole" as the first word in the ingredient list. Shopping Buy products labeled as "low-sodium" or "no salt added." Buy fresh foods. Avoid canned foods and pre-made or frozen meals. Cooking Avoid adding salt when cooking. Use salt-free seasonings or herbs instead of table salt or sea salt. Check with your health care provider or pharmacist before using salt substitutes. Do not fry foods. Cook foods using healthy methods such as baking, boiling, grilling, roasting, and broiling instead. Cook with heart-healthy oils, such as olive, canola, avocado, soybean, or sunflower oil. Meal planning  Eat a balanced diet that includes: 4 or more servings of fruits and 4 or more servings of vegetables each day. Try to fill one-half of your plate with fruits and vegetables. 6-8 servings of whole grains each day. Less than 6 oz (170 g) of lean meat, poultry, or fish each day. A 3-oz (85-g) serving of meat is about the same size as a deck of cards. One egg equals 1 oz (28 g). 2-3  servings of low-fat dairy each day. One serving is 1 cup (237 mL). 1 serving of nuts, seeds, or beans 5 times each week. 2-3 servings of heart-healthy fats. Healthy fats called omega-3 fatty acids are found in foods such as walnuts, flaxseeds, fortified milks, and eggs. These fats are also found in cold-water fish, such as sardines, salmon, and mackerel. Limit how much you eat of: Canned or prepackaged foods. Food that is high in trans fat, such as some fried foods. Food that is high in saturated fat, such as fatty meat. Desserts and other sweets, sugary drinks, and other foods with added sugar. Full-fat dairy products. Do not salt foods before eating. Do not eat more than 4 egg yolks a week. Try to eat at least 2 vegetarian meals a week. Eat more home-cooked food and less restaurant, buffet, and fast food. Lifestyle When eating at a restaurant, ask that your food be prepared with less salt or no salt, if possible. If you drink alcohol: Limit how much you use to: 0-1 drink a day for women who are not pregnant. 0-2 drinks a day for men. Be aware of how much alcohol is in your drink. In the [place] one drink equals one 12 oz bottle of beer (355 mL), one 5 oz glass of wine (148 mL), or one 1 oz glass of hard liquor (44 mL). General information Avoid eating more than 2,300 mg of salt a day. If you have hypertension, you may need  to reduce your sodium intake to 1,500 mg a day. Work with your health care provider to maintain a healthy body weight or to lose weight. Ask what an ideal weight is for you. Get at least 30 minutes of exercise that causes your heart to beat faster (aerobic exercise) most days of the week. Activities may include walking, swimming, or biking. Work with your health care provider or dietitian to adjust your eating plan to your individual calorie needs. What foods should I eat? Fruits All fresh, dried, or frozen fruit. Canned fruit in natural juice (without added  sugar). Vegetables Fresh or frozen vegetables (raw, steamed, roasted, or grilled). Low-sodium or reduced-sodium tomato and vegetable juice. Low-sodium or reduced-sodium tomato sauce and tomato paste. Low-sodium or reduced-sodium canned vegetables. Grains Whole-grain or whole-wheat bread. Whole-grain or whole-wheat pasta. Brown rice. [place]. Bulgur. Whole-grain and low-sodium cereals. Pita bread. Low-fat, low-sodium crackers. Whole-wheat flour tortillas. Meats and other proteins Skinless chicken or [place]. Ground chicken or [place]. Pork with fat trimmed off. Fish and seafood. Egg whites. Dried beans, peas, or lentils. Unsalted nuts, nut butters, and seeds. Unsalted canned beans. Lean cuts of beef with fat trimmed off. Low-sodium, lean precooked or cured meat, such as sausages or meat loaves. Dairy Low-fat (1%) or fat-free (skim) milk. Reduced-fat, low-fat, or fat-free cheeses. Nonfat, low-sodium ricotta or cottage cheese. Low-fat or nonfat yogurt. Low-fat, low-sodium cheese. Fats and oils Soft margarine without trans fats. Vegetable oil. Reduced-fat, low-fat, or light mayonnaise and salad dressings (reduced-sodium). Canola, safflower, olive, avocado, soybean, and sunflower oils. Avocado. Seasonings and condiments Herbs. Spices. Seasoning mixes without salt. Other foods Unsalted popcorn and pretzels. Fat-free sweets. The items listed above may not be a complete list of foods and beverages you can eat. Contact a dietitian for more information. What foods should I avoid? Fruits Canned fruit in a light or heavy syrup. Fried fruit. Fruit in cream or butter sauce. Vegetables Creamed or fried vegetables. Vegetables in a cheese sauce. Regular canned vegetables (not low-sodium or reduced-sodium). Regular canned tomato sauce and paste (not low-sodium or reduced-sodium). Regular tomato and vegetable juice (not low-sodium or reduced-sodium). Angie Fava. Olives. Grains Baked goods made with fat, such  as croissants, muffins, or some breads. Dry pasta or rice meal packs. Meats and other proteins Fatty cuts of meat. Ribs. Fried meat. Berniece Salines. Bologna, salami, and other precooked or cured meats, such as sausages or meat loaves. Fat from the back of a pig (fatback). Bratwurst. Salted nuts and seeds. Canned beans with added salt. Canned or smoked fish. Whole eggs or egg yolks. Chicken or [place] with skin. Dairy Whole or 2% milk, cream, and half-and-half. Whole or full-fat cream cheese. Whole-fat or sweetened yogurt. Full-fat cheese. Nondairy creamers. Whipped toppings. Processed cheese and cheese spreads. Fats and oils Butter. Stick margarine. Lard. Shortening. Ghee. Bacon fat. Tropical oils, such as coconut, palm kernel, or palm oil. Seasonings and condiments Onion salt, garlic salt, seasoned salt, table salt, and sea salt. Worcestershire sauce. Tartar sauce. Barbecue sauce. Teriyaki sauce. Soy sauce, including reduced-sodium. Steak sauce. Canned and packaged gravies. Fish sauce. Oyster sauce. Cocktail sauce. Store-bought horseradish. Ketchup. Mustard. Meat flavorings and tenderizers. Bouillon cubes. Hot sauces. Pre-made or packaged marinades. Pre-made or packaged taco seasonings. Relishes. Regular salad dressings. Other foods Salted popcorn and pretzels. The items listed above may not be a complete list of foods and beverages you should avoid. Contact a dietitian for more information. Where to find more information National Heart, Lung, and Blood Institute: https://wilson-eaton.com/ American Heart Association:  www.heart.org Academy of Nutrition and Dietetics: www.eatright.org National Kidney Foundation: www.kidney.org Summary The DASH eating plan is a healthy eating plan that has been shown to reduce high blood pressure (hypertension). It may also reduce your risk for type 2 diabetes, heart disease, and stroke. When on the DASH eating plan, aim to eat more fresh fruits and vegetables, whole grains, lean  proteins, low-fat dairy, and heart-healthy fats. With the DASH eating plan, you should limit salt (sodium) intake to 2,300 mg a day. If you have hypertension, you may need to reduce your sodium intake to 1,500 mg a day. Work with your health care provider or dietitian to adjust your eating plan to your individual calorie needs. This information is not intended to replace advice given to you by your health care provider. Make sure you discuss any questions you have with your health care provider. Document Revised: 10/03/2019 Document Reviewed: 10/03/2019 Elsevier Patient Education  2023 [employer].

## 2023-03-19 ENCOUNTER — Telehealth: Payer: Self-pay

## 2023-03-19 ENCOUNTER — Ambulatory Visit
Admission: RE | Admit: 2023-03-19 | Discharge: 2023-03-19 | Disposition: A | Discharge status: Home / Self Care | Payer: Medicare Other | Source: Ambulatory Visit | Attending: Family | Admitting: Family

## 2023-03-19 DIAGNOSIS — M7989 Other specified soft tissue disorders: Secondary | ICD-10-CM | POA: Diagnosis not present

## 2023-03-19 DIAGNOSIS — I739 Peripheral vascular disease, unspecified: Secondary | ICD-10-CM | POA: Diagnosis not present

## 2023-03-19 NOTE — Telephone Encounter (Signed)
LVM to call back to go over results 

## 2023-03-20 ENCOUNTER — Ambulatory Visit
Admission: RE | Admit: 2023-03-20 | Discharge: 2023-03-20 | Disposition: A | Discharge status: Home / Self Care | Payer: Medicare Other | Source: Ambulatory Visit | Attending: Acute Care | Admitting: Acute Care

## 2023-03-20 DIAGNOSIS — Z87891 Personal history of nicotine dependence: Secondary | ICD-10-CM | POA: Diagnosis not present

## 2023-03-20 DIAGNOSIS — F17210 Nicotine dependence, cigarettes, uncomplicated: Secondary | ICD-10-CM | POA: Diagnosis not present

## 2023-03-20 DIAGNOSIS — R911 Solitary pulmonary nodule: Secondary | ICD-10-CM

## 2023-03-23 ENCOUNTER — Telehealth: Payer: Self-pay

## 2023-03-23 NOTE — Telephone Encounter (Signed)
Patient states he would like to have the results of his ultrasound and CT scan.

## 2023-03-23 NOTE — Telephone Encounter (Signed)
LVM on pt phone that I will call him when his results are in from xray

## 2023-03-23 NOTE — Telephone Encounter (Signed)
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  Patient states we may leave a message if we can and if not we can call back.

## 2023-03-26 ENCOUNTER — Other Ambulatory Visit: Payer: Self-pay

## 2023-03-26 DIAGNOSIS — F17210 Nicotine dependence, cigarettes, uncomplicated: Secondary | ICD-10-CM

## 2023-03-26 DIAGNOSIS — Z87891 Personal history of nicotine dependence: Secondary | ICD-10-CM

## 2023-03-29 NOTE — Telephone Encounter (Signed)
LVM to inform pt results are still not in as of yet

## 2023-04-01 NOTE — Progress Notes (Deleted)
   SUBJECTIVE:  No chief complaint on file.  HPI ***  PERTINENT PMH / PSH: HTN HLD PHTN COPD   OBJECTIVE:  There were no vitals taken for this visit.   Physical Exam  ASSESSMENT/PLAN:  There are no diagnoses linked to this encounter. PDMP reviewed***  No follow-ups on file.  Dana Allan, MD

## 2023-04-02 ENCOUNTER — Encounter: Payer: Medicare Other | Admitting: Family Medicine

## 2023-04-02 ENCOUNTER — Ambulatory Visit (INDEPENDENT_AMBULATORY_CARE_PROVIDER_SITE_OTHER): Payer: Medicare Other

## 2023-04-02 DIAGNOSIS — Z0000 Encounter for general adult medical examination without abnormal findings: Secondary | ICD-10-CM

## 2023-04-02 NOTE — Patient Instructions (Signed)
Health Maintenance, Male Adopting a healthy lifestyle and getting preventive care are important in promoting health and wellness. Ask your health care provider about: The right schedule for you to have regular tests and exams. Things you can do on your own to prevent diseases and keep yourself healthy. What should I know about diet, weight, and exercise? Eat a healthy diet  Eat a diet that includes plenty of vegetables, fruits, low-fat dairy products, and lean protein. Do not eat a lot of foods that are high in solid fats, added sugars, or sodium. Maintain a healthy weight Body mass index (BMI) is a measurement that can be used to identify possible weight problems. It estimates body fat based on height and weight. Your health care provider can help determine your BMI and help you achieve or maintain a healthy weight. Get regular exercise Get regular exercise. This is one of the most important things you can do for your health. Most adults should: Exercise for at least 150 minutes each week. The exercise should increase your heart rate and make you sweat (moderate-intensity exercise). Do strengthening exercises at least twice a week. This is in addition to the moderate-intensity exercise. Spend less time sitting. Even light physical activity can be beneficial. Watch cholesterol and blood lipids Have your blood tested for lipids and cholesterol at 73 years of age, then have this test every 5 years. You may need to have your cholesterol levels checked more often if: Your lipid or cholesterol levels are high. You are older than 73 years of age. You are at high risk for heart disease. What should I know about cancer screening? Many types of cancers can be detected early and may often be prevented. Depending on your health history and family history, you may need to have cancer screening at various ages. This may include screening for: Colorectal cancer. Prostate cancer. Skin cancer. Lung  cancer. What should I know about heart disease, diabetes, and high blood pressure? Blood pressure and heart disease High blood pressure causes heart disease and increases the risk of stroke. This is more likely to develop in people who have high blood pressure readings or are overweight. Talk with your health care provider about your target blood pressure readings. Have your blood pressure checked: Every 3-5 years if you are 18-39 years of age. Every year if you are 40 years old or older. If you are between the ages of 65 and 75 and are a current or former smoker, ask your health care provider if you should have a one-time screening for abdominal aortic aneurysm (AAA). Diabetes Have regular diabetes screenings. This checks your fasting blood sugar level. Have the screening done: Once every three years after age 45 if you are at a normal weight and have a low risk for diabetes. More often and at a younger age if you are overweight or have a high risk for diabetes. What should I know about preventing infection? Hepatitis B If you have a higher risk for hepatitis B, you should be screened for this virus. Talk with your health care provider to find out if you are at risk for hepatitis B infection. Hepatitis C Blood testing is recommended for: Everyone born from 1945 through 1965. Anyone with known risk factors for hepatitis C. Sexually transmitted infections (STIs) You should be screened each year for STIs, including gonorrhea and chlamydia, if: You are sexually active and are younger than 73 years of age. You are older than 73 years of age and your   health care provider tells you that you are at risk for this type of infection. Your sexual activity has changed since you were last screened, and you are at increased risk for chlamydia or gonorrhea. Ask your health care provider if you are at risk. Ask your health care provider about whether you are at high risk for HIV. Your health care provider  may recommend a prescription medicine to help prevent HIV infection. If you choose to take medicine to prevent HIV, you should first get tested for HIV. You should then be tested every 3 months for as long as you are taking the medicine. Follow these instructions at home: Alcohol use Do not drink alcohol if your health care provider tells you not to drink. If you drink alcohol: Limit how much you have to 0-2 drinks a day. Know how much alcohol is in your drink. In the [place] one drink equals one 12 oz bottle of beer (355 mL), one 5 oz glass of wine (148 mL), or one 1 oz glass of hard liquor (44 mL). Lifestyle Do not use any products that contain nicotine or tobacco. These products include cigarettes, chewing tobacco, and vaping devices, such as e-cigarettes. If you need help quitting, ask your health care provider. Do not use street drugs. Do not share needles. Ask your health care provider for help if you need support or information about quitting drugs. General instructions Schedule regular health, dental, and eye exams. Stay current with your vaccines. Tell your health care provider if: You often feel depressed. You have ever been abused or do not feel safe at home. Summary Adopting a healthy lifestyle and getting preventive care are important in promoting health and wellness. Follow your health care provider's instructions about healthy diet, exercising, and getting tested or screened for diseases. Follow your health care provider's instructions on monitoring your cholesterol and blood pressure. This information is not intended to replace advice given to you by your health care provider. Make sure you discuss any questions you have with your health care provider. Document Revised: 03/21/2021 Document Reviewed: 03/21/2021 Elsevier Patient Education  2023 Elsevier Inc.  
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
## 2023-04-04 ENCOUNTER — Encounter: Payer: Medicare Other | Admitting: Family Medicine

## 2023-04-06 NOTE — Telephone Encounter (Signed)
Attempted to call pt.  Was unable to leave a message voicemail is full.

## 2023-04-06 NOTE — Telephone Encounter (Signed)
Patient called for his ultrasound results.

## 2023-04-10 ENCOUNTER — Telehealth: Payer: Self-pay | Admitting: Family

## 2023-04-10 NOTE — Telephone Encounter (Signed)
  Call patient I have reviewed his annual CT chest which is overall very reassuring. There is mild cardiac enlargement. this was previously seen on echocardiogram from August 2021. Evidence of aortic atherosclerosis.  Please continue cholesterol medication.    Evidence of mild emphysema.  He will need to continue CT lung cancer screening program through pulmonology.  Please remind him this is an annual program.

## 2023-04-10 NOTE — Telephone Encounter (Signed)
Return pt call  Please see US arterial ultrasound result test notes from 03/19/2023 Let me know if he has questions

## 2023-04-12 NOTE — Telephone Encounter (Signed)
Tried to reach patient, no answer or voicemail available. Mailed letter

## 2023-04-13 NOTE — Telephone Encounter (Signed)
Attempted to call pt. No answer and mailbox is full.  

## 2023-04-19 NOTE — Telephone Encounter (Signed)
Pt vm is full unable to leave message

## 2023-04-20 NOTE — Telephone Encounter (Signed)
Letter mailed to pt on 04/20/23

## 2023-05-21 ENCOUNTER — Telehealth: Payer: Self-pay | Admitting: Pulmonary Disease

## 2023-05-21 NOTE — Telephone Encounter (Signed)
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

## 2023-05-21 NOTE — Telephone Encounter (Signed)
Compliance report has been printed. ATC the patient. LVM for the patient to return my call.
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
## 2023-05-22 NOTE — Telephone Encounter (Signed)
Per Efraim Kaufmann, the patient called back yesterday and said he will come today to pick up the report.  Nothing further needed.

## 2023-05-22 NOTE — Telephone Encounter (Signed)
ATC the patient. LVM for the patient to return my cal. I will try one more time due to the nature of this call.

## 2023-05-23 ENCOUNTER — Telehealth: Payer: Self-pay | Admitting: *Deleted

## 2023-05-23 NOTE — Telephone Encounter (Signed)
Lm for patient.  

## 2023-05-23 NOTE — Telephone Encounter (Signed)
(  Dr. Craige Cotta?)  PT feels like he NL needs the CPAP machine. 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 States he is out of compliance.   Should he be reevaluated? Please call @ [phone]

## 2023-05-23 NOTE — Telephone Encounter (Signed)
Pt is asking for a call concerning his cpap compliance. He is questioning the need for the cpap .

## 2023-05-24 NOTE — Telephone Encounter (Signed)
PT calling again asking to speak to [facility]. I sent a Teams to advise her. TY.

## 2023-05-24 NOTE — Telephone Encounter (Signed)
See message from 05/23/2023.   The patient is wanting to talk with a provider regarding his CPAP machine. Appt was made for him to see Buelah Manis, NP tomorrow at 1:30. The patient is aware.   Nothing further needed.

## 2023-05-24 NOTE — Telephone Encounter (Signed)
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 Feels he no longer needs this can this be addressed. He is very frustrated with this process.

## 2023-05-24 NOTE — Telephone Encounter (Signed)
ATC x1 LVM for patient to call our office back . Patient does have a f/u with Beth on 05/25/23.

## 2023-05-25 ENCOUNTER — Encounter: Payer: Self-pay | Admitting: *Deleted

## 2023-05-25 ENCOUNTER — Telehealth: Payer: Self-pay | Admitting: *Deleted

## 2023-05-25 ENCOUNTER — Encounter: Payer: Self-pay | Admitting: Primary Care

## 2023-05-25 ENCOUNTER — Ambulatory Visit (INDEPENDENT_AMBULATORY_CARE_PROVIDER_SITE_OTHER): Payer: Medicare Other | Admitting: Primary Care

## 2023-05-25 VITALS — BP 110/70 | HR 66 | Temp 98.0°F | Ht 68.0 in | Wt 162.2 lb

## 2023-05-25 DIAGNOSIS — G4733 Obstructive sleep apnea (adult) (pediatric): Secondary | ICD-10-CM

## 2023-05-25 NOTE — Telephone Encounter (Signed)
His options are to either schedule an ROV to discuss alternative therapies for sleep apnea if he is not able to tolerate using CPAP, or arrange for a repeat in-lab sleep study to determine whether he still has sleep apnea.

## 2023-05-25 NOTE — Progress Notes (Signed)
Reviewed and agree with assessment/plan.   Coralyn Helling, MD [facility] Pulmonary/Critical Care 05/25/2023, 4:04 PM Pager:  [phone]

## 2023-05-25 NOTE — Telephone Encounter (Signed)
Letter e-mailed to patient's employer as requested, [email or web address].  Nothing further needed.

## 2023-05-25 NOTE — Patient Instructions (Addendum)
Recommendations: - You NEED to wear CPAP EVERY NIGHT for 4 hours  - Look at getting wedge pillow to elevate your head while sleeping - Please sleep at a 30 degree or greater incline when you repeat your sleep study   Orders: - Home sleep study   Follow-up: - 31+ day follow-up for CPAP compliance

## 2023-05-25 NOTE — Assessment & Plan Note (Addendum)
-   Patient has mild OSA which was severe during REM sleep. He is not consistently wearing his CPAP. He was unable to get DOT physical. Epworth 0/24. He has no trouble sleeping at night and no daytime sleepiness. He does tolerate CPAP when wearing without reported issues. He is having alot of difficulty understanding his diagnosis of sleep apnea and the reasoning for treatment. He is somewhat argumentative and insistent that he does not need to wear CPAP. He tells me numerous times that he needs to go back to work. He has agreed to comply with nightly use of his CPAP until he can get repeat sleep testing done. Encourage patient get wedge pillow to elevate head 30 degrees. Note provided recommending he be able to extend DOT license until above has been completed.
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
## 2023-05-25 NOTE — Telephone Encounter (Signed)
Patient is on Beth's schedule in [place] to be seen this afternoon.  Issue will be addressed during his office visit today.

## 2023-05-28 ENCOUNTER — Encounter: Payer: Medicare Other | Admitting: Family Medicine

## 2023-06-04 ENCOUNTER — Ambulatory Visit (INDEPENDENT_AMBULATORY_CARE_PROVIDER_SITE_OTHER): Payer: Medicare Other | Admitting: Family Medicine

## 2023-06-04 VITALS — BP 128/72 | HR 62 | Temp 97.7°F | Ht 68.0 in | Wt 165.0 lb

## 2023-06-04 DIAGNOSIS — R7309 Other abnormal glucose: Secondary | ICD-10-CM | POA: Diagnosis not present

## 2023-06-04 DIAGNOSIS — Z720 Tobacco use: Secondary | ICD-10-CM

## 2023-06-04 DIAGNOSIS — J432 Centrilobular emphysema: Secondary | ICD-10-CM

## 2023-06-04 DIAGNOSIS — G4733 Obstructive sleep apnea (adult) (pediatric): Secondary | ICD-10-CM | POA: Diagnosis not present

## 2023-06-04 DIAGNOSIS — N400 Enlarged prostate without lower urinary tract symptoms: Secondary | ICD-10-CM

## 2023-06-04 DIAGNOSIS — I10 Essential (primary) hypertension: Secondary | ICD-10-CM | POA: Diagnosis not present

## 2023-06-04 DIAGNOSIS — I700 Atherosclerosis of aorta: Secondary | ICD-10-CM

## 2023-06-04 DIAGNOSIS — D696 Thrombocytopenia, unspecified: Secondary | ICD-10-CM

## 2023-06-04 NOTE — Patient Instructions (Addendum)
It was a pleasure meeting you today. Thank you for allowing me to take part in your health care.  Our goals for today as we discussed include:  Schedule appointment for blood work. Fast for 10 hours  Recommendation from Rennie Plowman the NP that seen you for the swelling of your leg  Negative blood clot in leg ( DVT)   There is evidence of fluid collection behind left knee indicating left knee Baker's cyst.  Conservative therapy include icing knee, Ace wrap if knee is causing pain would be treatment for this.   Recommend Pneumonia 20 vaccine   Recommend Shingles vaccine.  This is a 2 dose series and can be given at your local pharmacy.  Please talk to your pharmacist about this.   Recommend Tetanus Vaccination.  This is given every 10 years.    If you have any questions or concerns, please do not hesitate to call the office at [phone].  I look forward to our next visit and until then take care and stay safe.  Regards,   Dana Allan, MD   [facility]

## 2023-06-04 NOTE — Progress Notes (Signed)
SUBJECTIVE:   Chief Complaint  Patient presents with   Transitions Of Care   HPI Patient presents to clinic to transfer care  No acute concerns today  HLD Prescribed Lipitor  mg daily.  Reports not taking medication.  BPH Asymptomatic.  Prescribed Flomax mg daily.  Not taking  medication   HTN Asymptomatic.  Not currently on  medications.  Does not check BP at home  COPD/Emphysema/OSA/Lung nodules Does not tolerate CPAP Continued tobacco use daily Follows with Pulmonology  PERTINENT PMH / PSH: HTN HLD COPD/Emphysema Tobacco use  OBJECTIVE:  BP 128/72 (BP Location: Left Arm, Patient Position: Sitting, Cuff Size: Normal)   Pulse 62   Temp 97.7 F (36.5 C) (Oral)   Ht 5\' 8"  (1.727 m)   Wt 165 lb (74.8 kg)   SpO2 99%   BMI 25.09 kg/m    Physical Exam Constitutional:      General: He is not in acute distress.    Appearance: He is normal weight. He is not ill-appearing.  HENT:     Head: Normocephalic.     Right Ear: Tympanic membrane, ear canal and external ear normal.     Left Ear: Tympanic membrane, ear canal and external ear normal.  Eyes:     Conjunctiva/sclera: Conjunctivae normal.  Neck:     Thyroid: No thyromegaly or thyroid tenderness.  Cardiovascular:     Rate and Rhythm: Normal rate and regular rhythm.     Pulses: Normal pulses.  Pulmonary:     Effort: Pulmonary effort is normal.     Breath sounds: Normal breath sounds.  Abdominal:     General: Bowel sounds are normal.  Neurological:     Mental Status: He is alert. Mental status is at baseline.  Psychiatric:        Mood and Affect: Mood normal.        Behavior: Behavior normal.        Thought Content: Thought content normal.        Judgment: Judgment normal.        06/04/2023    2:09 PM 04/02/2023   11:17 AM 03/12/2023   11:44 AM 08/10/2021   12:49 PM 08/09/2020   12:44 PM  Depression screen PHQ 2/9  Decreased Interest 0 0 0 0 0  Down, Depressed, Hopeless 0 1 0 0 0  PHQ - 2 Score 0  1 0 0 0      06/04/2023    2:09 PM 05/18/2020    1:54 PM  GAD 7 : Generalized Anxiety Score  Nervous, Anxious, on Edge 0 0  Control/stop worrying 0 0  Worry too much - different things 0 0  Trouble relaxing 0 0  Restless 0 0  Easily annoyed or irritable 0 0  Afraid - awful might happen 0 0  Total GAD 7 Score 0 0  Anxiety Difficulty  Not difficult at all    ASSESSMENT/PLAN:  Primary hypertension Assessment & Plan: Chronic Well controlled without antihypertensives Goal <150/90 per JNC 8 guidelines Check Cmet Continue to monitor   Orders: -     Comprehensive metabolic panel; Future -     CBC; Future  Aortic atherosclerosis (HCC) Assessment & Plan: Chronic.  Noted on CT chest Prescribed statin, patient self discontinued.  Not currently interested in restarting medication Check fasting lipids  Orders: -     Lipid panel; Future  Abnormal glucose -     Hemoglobin A1c; Future  OSA (obstructive sleep apnea) Assessment & Plan: Chronic  Has CPAP but not using Follows with Pulmonology   Tobacco abuse Assessment & Plan: Chronic Not interested in assistance at this time Encouraged smoking cessation.   Recommend PCV 20 Follows with Pulmonology for Lung Ca screening   Centrilobular emphysema (HCC) Assessment & Plan: Chronic Noted on CT chest Does not use rescue therapy  Continued tobacco use Follows with Pulmonology   Benign prostatic hyperplasia, unspecified whether lower urinary tract symptoms present Assessment & Plan: Chronic Asymptomatic Self discontinued Flomax Previously evaluated by [facility]   Thrombocytopenia [facility]) Assessment & Plan: History of low platelets.  Check CBC    PDMP reviewed  Return if symptoms worsen or fail to improve, for PCP.  Dana Allan, MD

## 2023-06-12 ENCOUNTER — Telehealth: Payer: Self-pay | Admitting: Primary Care

## 2023-06-12 NOTE — Telephone Encounter (Signed)
30 day report has been printed and placed at the front desk for the patient.  ATC the patient.  LVM for the patient to return my call.

## 2023-06-12 NOTE — Telephone Encounter (Signed)
Patient would like a report of CPAP readings. Patient phone number is [phone].

## 2023-06-13 NOTE — Telephone Encounter (Signed)
Pt. Wishes to speak to nurse

## 2023-06-13 NOTE — Telephone Encounter (Signed)
According to airview- >4hr is 53%.   Lm for patient.

## 2023-06-13 NOTE — Telephone Encounter (Signed)
ATC the patient. LVM for the patient to return call.

## 2023-06-14 NOTE — Telephone Encounter (Signed)
Lm x2 for patient.  Will close encounter per office protocol.   

## 2023-06-17 ENCOUNTER — Encounter: Payer: Self-pay | Admitting: Family Medicine

## 2023-06-17 NOTE — Assessment & Plan Note (Signed)
Chronic Well controlled without antihypertensives Goal <150/90 per JNC 8 guidelines Check Cmet Continue to monitor

## 2023-06-17 NOTE — Assessment & Plan Note (Addendum)
Chronic.  Noted on CT chest Prescribed statin, patient self discontinued.  Not currently interested in restarting medication Check fasting lipids

## 2023-06-17 NOTE — Assessment & Plan Note (Signed)
Chronic Asymptomatic Self discontinued Flomax Previously evaluated by [facility]

## 2023-06-17 NOTE — Assessment & Plan Note (Signed)
Chronic Not interested in assistance at this time Encouraged smoking cessation.   Recommend PCV 20 Follows with Pulmonology for Lung Ca screening

## 2023-06-17 NOTE — Assessment & Plan Note (Signed)
History of low platelets.  Check CBC

## 2023-06-17 NOTE — Assessment & Plan Note (Signed)
Chronic Has CPAP but not using Follows with Pulmonology

## 2023-06-17 NOTE — Assessment & Plan Note (Signed)
Chronic Noted on CT chest Does not use rescue therapy  Continued tobacco use Follows with Pulmonology

## 2023-06-20 ENCOUNTER — Telehealth: Payer: Self-pay | Admitting: Pulmonary Disease

## 2023-06-20 DIAGNOSIS — G4733 Obstructive sleep apnea (adult) (pediatric): Secondary | ICD-10-CM

## 2023-06-20 NOTE — Telephone Encounter (Signed)
Pt. Calling back wants to have a in lab sleep study does not want to go through snap

## 2023-06-20 NOTE — Telephone Encounter (Signed)
Yes

## 2023-06-20 NOTE — Telephone Encounter (Signed)
Order for a home sleep study was placed on 05/25/2023. Are you ok with changing it to an In Lab study?

## 2023-06-20 NOTE — Telephone Encounter (Signed)
We would need a order per the provider and it will depend if insurance will cover it

## 2023-06-20 NOTE — Telephone Encounter (Signed)
I have placed the order for the in lab sleep study. I tried to contact the patient. He did not answer and his voicemail box was full. I will try again later.

## 2023-06-21 NOTE — Telephone Encounter (Signed)
ATC the patient again. His voicemail was full so I could not leave a message. I will try one more time due to the nature of the call.

## 2023-06-22 ENCOUNTER — Telehealth: Payer: Self-pay | Admitting: Pulmonary Disease

## 2023-06-22 NOTE — Telephone Encounter (Signed)
Patient is returning phone call. Patient phone number is [phone].

## 2023-06-22 NOTE — Telephone Encounter (Signed)
ATC patient-unable to leave vm due to mailbox being full. According to airview, patient is 73% >4hr.

## 2023-06-22 NOTE — Telephone Encounter (Signed)
ATC the patient. LVM for patient to return my call. 

## 2023-06-22 NOTE — Telephone Encounter (Signed)
I spoke with the patient. He asked for the number to [facility] so he can call and schedule his sleep study. I gave him that number.  Nothing further needed.

## 2023-06-22 NOTE — Telephone Encounter (Signed)
ATC the patient again, x3. His voicemail was full so I could not leave a message. Letter placed in the mail notifying the patient that we have placed an order for a In Lab Sleep Study.   I will close the encounter per office protocol.

## 2023-06-22 NOTE — Telephone Encounter (Signed)
Pt. Calling back let him know that split sleep has been ordered but want to know where his O2 stats are because he wanting to go back to work and needs it to be above 70% please advise pt.

## 2023-06-25 ENCOUNTER — Telehealth: Payer: Self-pay | Admitting: Primary Care

## 2023-06-25 NOTE — Telephone Encounter (Signed)
I wasn't aware order had been placed as [place] Patient. The  form has been faxed to [facility]

## 2023-06-25 NOTE — Telephone Encounter (Signed)
Per Fax from [facility], scheduled for initial study date: split 8/13 8:00pm.  Nothing further needed.

## 2023-06-25 NOTE — Telephone Encounter (Signed)
Patient states [facility] does not have referral for sleep study. Patient phone number is [phone].

## 2023-06-26 ENCOUNTER — Ambulatory Visit: Payer: Medicare Other | Attending: Otolaryngology

## 2023-06-26 DIAGNOSIS — R400 Somnolence: Secondary | ICD-10-CM | POA: Insufficient documentation

## 2023-06-26 DIAGNOSIS — I10 Essential (primary) hypertension: Secondary | ICD-10-CM | POA: Insufficient documentation

## 2023-06-26 DIAGNOSIS — R0683 Snoring: Secondary | ICD-10-CM | POA: Diagnosis present

## 2023-06-26 DIAGNOSIS — G4733 Obstructive sleep apnea (adult) (pediatric): Secondary | ICD-10-CM | POA: Diagnosis not present

## 2023-06-26 DIAGNOSIS — I272 Pulmonary hypertension, unspecified: Secondary | ICD-10-CM | POA: Insufficient documentation

## 2023-07-02 ENCOUNTER — Telehealth: Payer: Self-pay

## 2023-07-02 ENCOUNTER — Telehealth (HOSPITAL_BASED_OUTPATIENT_CLINIC_OR_DEPARTMENT_OTHER): Payer: Medicare Other | Admitting: Pulmonary Disease

## 2023-07-02 DIAGNOSIS — G4733 Obstructive sleep apnea (adult) (pediatric): Secondary | ICD-10-CM

## 2023-07-02 NOTE — Telephone Encounter (Signed)
Patient is calling for his sleep study results so that he can return back to work. He will need his percentage scores.Please call and advise. His number is [phone]

## 2023-07-02 NOTE — Telephone Encounter (Signed)
Mild -mod OSA with AHI 13/h, RDI 18/h , low sat 82%

## 2023-07-02 NOTE — Telephone Encounter (Signed)
Patient states he doesn't like taking pills, but his family has convinced him that he does need to take his cholesterol pills.  Patient states Dr. French Ana McLean-Scocuzza had him on cholesterol medication and he would like to see if Dr. Dana Allan will send in a prescription for him to start taking these again.  Patient states that this is heavy on his mind because his family convinced him that it is important for his heart and he would like to start taking it today.  Patient states [facility] on [address] is his preferred pharmacy.  I have scheduled patient for a fasting lab appointment on 07/05/2023.  Patient states he would like to have a call back.

## 2023-07-02 NOTE — Telephone Encounter (Signed)
ATC patient--unable to leave vm due to mailbox being full.   According to airview--patient as worn cpap >4hr 67%.

## 2023-07-03 NOTE — Telephone Encounter (Signed)
Lm x1 for patient.  Will call back once more.

## 2023-07-04 NOTE — Telephone Encounter (Signed)
Lm x2 for patient.  Will close encounter per office protocol.   

## 2023-07-05 ENCOUNTER — Other Ambulatory Visit (INDEPENDENT_AMBULATORY_CARE_PROVIDER_SITE_OTHER): Payer: Medicare Other

## 2023-07-05 ENCOUNTER — Telehealth: Payer: Self-pay | Admitting: Pulmonary Disease

## 2023-07-05 DIAGNOSIS — I10 Essential (primary) hypertension: Secondary | ICD-10-CM | POA: Diagnosis not present

## 2023-07-05 DIAGNOSIS — R7309 Other abnormal glucose: Secondary | ICD-10-CM

## 2023-07-05 DIAGNOSIS — I700 Atherosclerosis of aorta: Secondary | ICD-10-CM

## 2023-07-05 LAB — LIPID PANEL
Cholesterol: 210 mg/dL — ABNORMAL HIGH (ref 0–200)
HDL: 68 mg/dL (ref >39.00)
LDL Cholesterol: 130 mg/dL — ABNORMAL HIGH (ref 0–99)
NonHDL: 141.73
Total CHOL/HDL Ratio: 3
Triglycerides: 60 mg/dL (ref 0.0–149.0)
VLDL: 12 mg/dL (ref 0.0–40.0)

## 2023-07-05 LAB — COMPREHENSIVE METABOLIC PANEL
ALT: 7 U/L (ref 0–53)
AST: 13 U/L (ref 0–37)
Albumin: 3.6 g/dL (ref 3.5–5.2)
Alkaline Phosphatase: 66 U/L (ref 39–117)
BUN: 11 mg/dL (ref 6–23)
CO2: 28 mEq/L (ref 19–32)
Calcium: 8.5 mg/dL (ref 8.4–10.5)
Chloride: 106 mEq/L (ref 96–112)
Creatinine, Ser: 0.76 mg/dL (ref 0.40–1.50)
GFR: 89.11 mL/min (ref >60.00)
Glucose, Bld: 106 mg/dL — ABNORMAL HIGH (ref 70–99)
Potassium: 4.1 mEq/L (ref 3.5–5.1)
Sodium: 142 mEq/L (ref 135–145)
Total Bilirubin: 0.5 mg/dL (ref 0.2–1.2)
Total Protein: 6.3 g/dL (ref 6.0–8.3)

## 2023-07-05 LAB — CBC
HCT: 37.6 % — ABNORMAL LOW (ref 39.0–52.0)
Hemoglobin: 12.6 g/dL — ABNORMAL LOW (ref 13.0–17.0)
MCHC: 33.7 g/dL (ref 30.0–36.0)
MCV: 98.7 fl (ref 78.0–100.0)
Platelets: 153 10*3/uL (ref 150.0–400.0)
RBC: 3.81 Mil/uL — ABNORMAL LOW (ref 4.22–5.81)
RDW: 15.4 % (ref 11.5–15.5)
WBC: 6.2 10*3/uL (ref 4.0–10.5)

## 2023-07-05 LAB — HEMOGLOBIN A1C: Hgb A1c MFr Bld: 5.7 % (ref 4.6–6.5)

## 2023-07-05 NOTE — Telephone Encounter (Signed)
He needs to continue using CPAP.  He doesn't need any change to his current settings at CPAP 9 cm H2O.

## 2023-07-05 NOTE — Telephone Encounter (Signed)
ATC the patient. LVM for patient to return my call. 

## 2023-07-05 NOTE — Telephone Encounter (Signed)
Per telephone message from 07/02/2023, Dr. Hulan Fess interpretation of his sleep study. Mild -mod OSA with AHI 13/h, RDI 18/h , low sat 82%. Will the patient still need to be on his CPAP? And will his setting stay the same? (Patient's download is on your test to review)

## 2023-07-05 NOTE — Telephone Encounter (Signed)
Pt. Calling back a 2X to ask if Dr.Sood could read his sleep study because he's trying to get back to work please advise

## 2023-07-05 NOTE — Telephone Encounter (Signed)
Had a sleep study last wed, needs the results. As well as getting the percentage on his CPAP to do his physical to return back to work. Pt has been out of work for over 30 days & really needs to get back to work.

## 2023-07-06 ENCOUNTER — Encounter: Payer: Self-pay | Admitting: *Deleted

## 2023-07-06 ENCOUNTER — Telehealth: Payer: Self-pay | Admitting: Pulmonary Disease

## 2023-07-06 NOTE — Telephone Encounter (Signed)
See telephone message from 07/05/2023. I have spoke with the patient.

## 2023-07-06 NOTE — Telephone Encounter (Signed)
I spoke with the patent and notified him of his sleep study results and Dr. Evlyn Courier recommendation. He does have an appt next week to see Buelah Manis, NP and go over the results.  Nothing further needed.

## 2023-07-06 NOTE — Telephone Encounter (Signed)
Patient is calling back to get his test result [phone]

## 2023-07-06 NOTE — Telephone Encounter (Signed)
Lm x1 for patient.  

## 2023-07-06 NOTE — Telephone Encounter (Signed)
Lm x2 for patient.  Will close encounter per office protocol.   

## 2023-07-13 ENCOUNTER — Encounter: Payer: Self-pay | Admitting: Primary Care

## 2023-07-13 ENCOUNTER — Ambulatory Visit (INDEPENDENT_AMBULATORY_CARE_PROVIDER_SITE_OTHER): Payer: Medicare Other | Admitting: Primary Care

## 2023-07-13 VITALS — BP 120/70 | HR 58 | Temp 97.7°F | Ht 68.0 in | Wt 163.2 lb

## 2023-07-13 DIAGNOSIS — G4733 Obstructive sleep apnea (adult) (pediatric): Secondary | ICD-10-CM
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
## 2023-07-13 NOTE — Patient Instructions (Addendum)
Good job wearing your CPAP, its my recommendation that you are ok to return to work Continue to wear CPAP every night for 4 hours or more Do not drive if tired  Follow-up 6 months with Beth NP  CPAP and BIPAP Information CPAP and BIPAP are methods that use air pressure to keep your airways open and to help you breathe well. CPAP and BIPAP use different amounts of pressure. Your health care provider will tell you whether CPAP or BIPAP would be more helpful for you. CPAP stands for "continuous positive airway pressure." With CPAP, the amount of pressure stays the same while you breathe in (inhale) and out (exhale). BIPAP stands for "bi-level positive airway pressure." With BIPAP, the amount of pressure will be higher when you inhale and lower when you exhale. This allows you to take larger breaths. CPAP or BIPAP may be used in the hospital, or your health care provider may want you to use it at home. You may need to have a sleep study before your health care provider can order a machine for you to use at home. What are the advantages? CPAP or BIPAP can be helpful if you have: Sleep apnea. Chronic obstructive pulmonary disease (COPD). Heart failure. Medical conditions that cause muscle weakness, including muscular dystrophy or amyotrophic lateral sclerosis (ALS). Other problems that cause breathing to be shallow, weak, abnormal, or difficult. CPAP and BIPAP are most commonly used for obstructive sleep apnea (OSA) to keep the airways from collapsing when the muscles relax during sleep. What are the risks? Generally, this is a safe treatment. However, problems may occur, including: Irritated skin or skin sores if the mask does not fit properly. Dry or stuffy nose or nosebleeds. Dry mouth. Feeling gassy or bloated. Sinus or lung infection if the equipment is not cleaned properly. When should CPAP or BIPAP be used? In most cases, the mask only needs to be worn during sleep. Generally, the mask  needs to be worn throughout the night and during any daytime naps. People with certain medical conditions may also need to wear the mask at other times, such as when they are awake. Follow instructions from your health care provider about when to use the machine. What happens during CPAP or BIPAP?  Both CPAP and BIPAP are provided by a small machine with a flexible plastic tube that attaches to a plastic mask that you wear. Air is blown through the mask into your nose or mouth. The amount of pressure that is used to blow the air can be adjusted on the machine. Your health care provider will set the pressure setting and help you find the best mask for you. Tips for using the mask Because the mask needs to be snug, some people feel trapped or closed-in (claustrophobic) when first using the mask. If you feel this way, you may need to get used to the mask. One way to do this is to hold the mask loosely over your nose or mouth and then gradually apply the mask more snugly. You can also gradually increase the amount of time that you use the mask. Masks are available in various types and sizes. If your mask does not fit well, talk with your health care provider about getting a different one. Some common types of masks include: Full face masks, which fit over the mouth and nose. Nasal masks, which fit over the nose. Nasal pillow or prong masks, which fit into the nostrils. If you are using a mask that fits  over your nose and you tend to breathe through your mouth, a chin strap may be applied to help keep your mouth closed. Use a skin barrier to protect your skin as told by your health care provider. Some CPAP and BIPAP machines have alarms that may sound if the mask comes off or develops a leak. If you have trouble with the mask, it is very important that you talk with your health care provider about finding a way to make the mask easier to tolerate. Do not stop using the mask. There could be a negative impact  on your health if you stop using the mask. Tips for using the machine Place your CPAP or BIPAP machine on a secure table or stand near an electrical outlet. Know where the on/off switch is on the machine. Follow instructions from your health care provider about how to set the pressure on your machine and when you should use it. Do not eat or drink while the CPAP or BIPAP machine is on. Food or fluids could get pushed into your lungs by the pressure of the CPAP or BIPAP. For home use, CPAP and BIPAP machines can be rented or purchased through home health care companies. Many different brands of machines are available. Renting a machine before purchasing may help you find out which particular machine works well for you. Your health insurance company may also decide which machine you may get. Keep the CPAP or BIPAP machine and attachments clean. Ask your health care provider for specific instructions. Check the humidifier if you have a dry stuffy nose or nosebleeds. Make sure it is working correctly. Follow these instructions at home: Take over-the-counter and prescription medicines only as told by your health care provider. Ask if you can take sinus medicine if your sinuses are blocked. Do not use any products that contain nicotine or tobacco. These products include cigarettes, chewing tobacco, and vaping devices, such as e-cigarettes. If you need help quitting, ask your health care provider. Keep all follow-up visits. This is important. Contact a health care provider if: You have redness or pressure sores on your head, face, mouth, or nose from the mask or head gear. You have trouble using the CPAP or BIPAP machine. You cannot tolerate wearing the CPAP or BIPAP mask. Someone tells you that you snore even when wearing your CPAP or BIPAP. Get help right away if: You have trouble breathing. You feel confused. Summary CPAP and BIPAP are methods that use air pressure to keep your airways open and to  help you breathe well. If you have trouble with the mask, it is very important that you talk with your health care provider about finding a way to make the mask easier to tolerate. Do not stop using the mask. There could be a negative impact to your health if you stop using the mask. Follow instructions from your health care provider about when to use the machine. This information is not intended to replace advice given to you by your health care provider. Make sure you discuss any questions you have with your health care provider. Document Revised: 06/08/2021 Document Reviewed: 10/08/2020 Elsevier Patient Education  2023 [employer].

## 2023-07-14 NOTE — Assessment & Plan Note (Signed)
-   Sleep apnea is well controlled on current CPAP. No daytime sleepiness. Epworth score 2/24 - Repeat HST showed mild-moderate OSA, AHI 13/hour with SpO2 low 82%. - CPAP compliance has improve, he is 97% compliant with CPAP use. Average usage 4 hours 20 mins. - Current pressure 9cm h20; Residual AHI 0.3/hour  - Note provided for work, ok to return -Reinforced importance of wearing CPAP nightly 4 hours or more advised against driving if experiencing excessive daytime sleepiness fatigue - FU in 6 months or sooner if needed

## 2023-07-16 NOTE — Progress Notes (Signed)
Reviewed and agree with assessment/plan.   Coralyn Helling, MD [facility] Pulmonary/Critical Care 07/16/2023, 7:07 AM Pager:  [phone]

## 2023-07-17 ENCOUNTER — Telehealth: Payer: Self-pay | Admitting: Family Medicine

## 2023-07-17 NOTE — Telephone Encounter (Signed)
Pt called in stating he needs prove of he lab results specially diabetes so he can return to work. Pt stated we can fax a copy of it to [facility] [phone].  I'm really not sure what the pt wants, but if a nurse can give him a call to find out what he wants [place] to do. I was a little confused on what he requesting. Note from provider under lab results was read to him. Pt still not clear. Please advice. Pt also stated he has an old phone that does not ring.

## 2023-07-18 NOTE — Telephone Encounter (Signed)
Lab results with notes placed in folder for pt to pick up.

## 2023-07-18 NOTE — Telephone Encounter (Signed)
Patient just called and states he need his lab results to show if he is a diabetic or not. He is trying to go back to work. He is on the way up here to drop off the paperwork.

## 2023-07-18 NOTE — Telephone Encounter (Signed)
Pt came into office and picked up the results of lab results.

## 2023-07-18 NOTE — Telephone Encounter (Signed)
Left message to return call to our office.  

## 2023-07-19 ENCOUNTER — Telehealth: Payer: Self-pay | Admitting: Primary Care

## 2023-07-19 NOTE — Telephone Encounter (Signed)
Patient is calling because he needs new Cpap supplies(hose and mask). I advised him to call [facility] but he was told to call Walsh,NP.

## 2023-07-20 ENCOUNTER — Ambulatory Visit (INDEPENDENT_AMBULATORY_CARE_PROVIDER_SITE_OTHER): Payer: Medicare Other | Admitting: Family Medicine

## 2023-07-20 ENCOUNTER — Encounter: Payer: Self-pay | Admitting: Family Medicine

## 2023-07-20 VITALS — BP 116/68 | HR 55 | Temp 98.0°F | Resp 16 | Ht 68.0 in | Wt 162.2 lb

## 2023-07-20 DIAGNOSIS — D509 Iron deficiency anemia, unspecified: Secondary | ICD-10-CM

## 2023-07-20 DIAGNOSIS — I10 Essential (primary) hypertension: Secondary | ICD-10-CM | POA: Diagnosis not present

## 2023-07-20 DIAGNOSIS — I700 Atherosclerosis of aorta: Secondary | ICD-10-CM

## 2023-07-20 DIAGNOSIS — E782 Mixed hyperlipidemia: Secondary | ICD-10-CM

## 2023-07-20 DIAGNOSIS — G4733 Obstructive sleep apnea (adult) (pediatric): Secondary | ICD-10-CM | POA: Diagnosis not present

## 2023-07-20 MED ORDER — IRON (FERROUS SULFATE) 325 (65 FE) MG PO TABS
325.0000 mg | ORAL_TABLET | ORAL | 3 refills | Status: DC
Start: 2023-07-20 — End: 2024-08-11

## 2023-07-20 NOTE — Patient Instructions (Addendum)
It was a pleasure meeting you today. Thank you for allowing me to take part in your health care.  Our goals for today as we discussed include:  Recommend Tetanus Vaccination.  This is given every 10 years.   Recommend Shingles vaccine.  This is a 2 dose series and can be given at your local pharmacy.  Please talk to your pharmacist about this.   Recommend Pneumonia 20 Vaccination.  This is one time vaccine.   Cholesterol has decreased.  Continue healthy diet and increase activity.  Consider restarting low dose statin.  A1c 5.7.  Improved from 5.9. Prediabetes   Iron is a little low Start Ferrous Sulfate 1 tablet every other day  Increase soluble fibre daily Can take Metamucil daily.  Over the counter puchase.  Will also help lower cholesterol.  Follow up with Pulmonology as scheduled  If you would like some assistance to help quit smoking please schedule an appointment with me to discuss the many ways that we can assist you if and when you decide you are ready to take the next step toward a smoke free lifestyle.  1-800QUITNOW  Recommend Tetanus Vaccination.  This is given every 10 years.   Recommend Shingles vaccine.  This is a 2 dose series and can be given at your local pharmacy.  Please talk to your pharmacist about this.   Recommend Pneumonia 20 Vaccination.    Recommend Flu vaccine.  Follow up as needed   If you have any questions or concerns, please do not hesitate to call the office at [phone].  I look forward to our next visit and until then take care and stay safe.  Regards,   Dana Allan, MD   [facility]

## 2023-07-20 NOTE — Assessment & Plan Note (Signed)
Chronic On CPAP Continue to follow with Pulmonology

## 2023-07-20 NOTE — Assessment & Plan Note (Signed)
Chronic LDL improved Not currently on statin Continue to modify diet and increase activity as tolerated

## 2023-07-20 NOTE — Progress Notes (Signed)
SUBJECTIVE:   Chief Complaint  Patient presents with   Abnormal Lab    Worried about diabetic   HPI Patient presents to clinic for follow up chronic disease management  No acute concerns today  Reviewed recent labs today  HLD Recent LDL 130.  Improved.  Not taking statin as previously prescribed.   HTN Asymptomatic.  Not currently on  medications.  Does not check BP at home   COPD/Emphysema/OSA/Lung nodules Tolerating CPAP a little more Continued tobacco use daily   PERTINENT PMH / PSH: HTN HLD COPD/Emphysema Tobacco use  OBJECTIVE:  BP 116/68   Pulse (!) 55   Temp 98 F (36.7 C)   Resp 16   Ht 5\' 8"  (1.727 m)   Wt 162 lb 4 oz (73.6 kg)   SpO2 97%   BMI 24.67 kg/m    Physical Exam Vitals reviewed.  Constitutional:      General: He is not in acute distress.    Appearance: Normal appearance. He is normal weight. He is not ill-appearing, toxic-appearing or diaphoretic.  Eyes:     General:        Right eye: No discharge.        Left eye: No discharge.  Cardiovascular:     Rate and Rhythm: Normal rate and regular rhythm.     Heart sounds: Normal heart sounds.  Pulmonary:     Effort: Pulmonary effort is normal.     Breath sounds: Normal breath sounds.  Abdominal:     General: Bowel sounds are normal.  Musculoskeletal:        General: Normal range of motion.     Cervical back: Normal range of motion.  Skin:    General: Skin is warm and dry.  Neurological:     Mental Status: He is alert and oriented to person, place, and time. Mental status is at baseline.  Psychiatric:        Mood and Affect: Mood normal.        Behavior: Behavior normal.        Thought Content: Thought content normal.        Judgment: Judgment normal.        07/20/2023    8:42 AM 06/04/2023    2:09 PM 04/02/2023   11:17 AM 03/12/2023   11:44 AM 08/10/2021   12:49 PM  Depression screen PHQ 2/9  Decreased Interest 0 0 0 0 0  Down, Depressed, Hopeless 0 0 1 0 0  PHQ - 2 Score  0 0 1 0 0  Altered sleeping 0      Tired, decreased energy 0      Change in appetite 0      Feeling bad or failure about yourself  0      Trouble concentrating 0      Moving slowly or fidgety/restless 0      Suicidal thoughts 0      PHQ-9 Score 0      Difficult doing work/chores Not difficult at all          07/20/2023    8:42 AM 06/04/2023    2:09 PM 05/18/2020    1:54 PM  GAD 7 : Generalized Anxiety Score  Nervous, Anxious, on Edge 0 0 0  Control/stop worrying 0 0 0  Worry too much - different things 0 0 0  Trouble relaxing 0 0 0  Restless 0 0 0  Easily annoyed or irritable 0 0 0  Afraid - awful might happen  0 0 0  Total GAD 7 Score 0 0 0  Anxiety Difficulty Not difficult at all  Not difficult at all    ASSESSMENT/PLAN:  Primary hypertension Assessment & Plan: Chronic Well controlled without antihypertensives Goal <150/90 per JNC 8 guidelines Recent labs wnl Continue to monitor    Aortic calcification (HCC) Assessment & Plan: Chronic Was prescribed Lipitor but self discontinued Not interested in restarting medication   Mixed hyperlipidemia Assessment & Plan: Chronic LDL improved Not currently on statin Continue to modify diet and increase activity as tolerated   OSA (obstructive sleep apnea) Assessment & Plan: Chronic On CPAP Continue to follow with Pulmonology   Iron deficiency anemia, unspecified iron deficiency anemia type -     Iron (Ferrous Sulfate); Take 325 mg by mouth every other day.  Dispense: 90 tablet; Refill: 3    PDMP reviewed  Return if symptoms worsen or fail to improve, for PCP.  Dana Allan, MD

## 2023-07-20 NOTE — Telephone Encounter (Signed)
Order to renew cpap supplies was just placed on 07/13/23 and sent to [facility] on 07/17/23 and confirmed by Terri C with [employer] on 07/19/23

## 2023-07-20 NOTE — Assessment & Plan Note (Signed)
Chronic Well controlled without antihypertensives Goal <150/90 per JNC 8 guidelines Recent labs wnl Continue to monitor

## 2023-07-20 NOTE — Assessment & Plan Note (Signed)
Chronic Was prescribed Lipitor but self discontinued Not interested in restarting medication

## 2023-07-25 ENCOUNTER — Telehealth: Payer: Self-pay | Admitting: Pulmonary Disease

## 2023-07-25 NOTE — Telephone Encounter (Signed)
Please see last signed encounter. I called [employer] and asked for Terri C. She is on vacation but rep I spoke to said they have no order, yet Terri C. Confirmed it. Please try to straighten out for PT . This was to be shipped direct to his house. Thanks.   His # is [phone]

## 2023-07-27 NOTE — Telephone Encounter (Signed)
Atc pt, no answer. Lvmm

## 2023-09-03 ENCOUNTER — Ambulatory Visit: Payer: Medicare Other | Admitting: Primary Care

## 2023-11-27 ENCOUNTER — Telehealth: Payer: Self-pay

## 2023-11-27 NOTE — Telephone Encounter (Signed)
 Pt need to sign form for DOT.  Form placed in front office.

## 2023-12-07 ENCOUNTER — Telehealth: Payer: Self-pay | Admitting: Primary Care

## 2023-12-07 NOTE — Telephone Encounter (Signed)
Received fax report with copy of patient's sleep study from 06/26/2023 which showed evidence of mild obstructive sleep apnea, AHI 13.4 with SpO2 low at 82%.  We will scan sleep study result into chart.  Patient is on CPAP, last seen in August 2024. DME order was placed for patient to receive supplies through [facility].  Can we please check to make sure patient is getting supplies sent to him from DME company

## 2023-12-10 NOTE — Telephone Encounter (Signed)
Left message for patient to call clinic to make sure patient is getting supplies for CPAP from DME company.

## 2023-12-10 NOTE — Telephone Encounter (Signed)
Patient has not received any supplies through DME company. He has Cpap from Medicare so he has not returned it to the company that was trying to take it. 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 Patient actually needs a filter for it now.  Patient can be reached [phone]

## 2023-12-12 NOTE — Telephone Encounter (Signed)
Patient is returning missed call. He drives a truck so call twice.

## 2023-12-12 NOTE — Telephone Encounter (Signed)
ATC X1. LMTCB

## 2023-12-13 NOTE — Telephone Encounter (Signed)
I called and spoke with the [employer]- Terri  She states the reason they have not sent supplies is due to pt's non compliance with CPAP  His ins will not cover supplies  She suggested he can order his supplies from [place], and if he is compliant for 30 days, they can revisit supplies from DME  I called and spoke with the pt and notified of this and he verbalized understanding    Nothing further needed

## 2024-01-04 ENCOUNTER — Ambulatory Visit: Payer: Medicare Other | Admitting: Family Medicine

## 2024-01-07 ENCOUNTER — Encounter: Payer: Self-pay | Admitting: Family Medicine

## 2024-01-07 ENCOUNTER — Ambulatory Visit (INDEPENDENT_AMBULATORY_CARE_PROVIDER_SITE_OTHER): Payer: Medicare Other

## 2024-01-07 ENCOUNTER — Ambulatory Visit (INDEPENDENT_AMBULATORY_CARE_PROVIDER_SITE_OTHER): Payer: Medicare Other | Admitting: Family Medicine

## 2024-01-07 VITALS — BP 120/80 | HR 81 | Temp 98.2°F | Wt 172.4 lb

## 2024-01-07 DIAGNOSIS — R809 Proteinuria, unspecified: Secondary | ICD-10-CM

## 2024-01-07 DIAGNOSIS — R300 Dysuria: Secondary | ICD-10-CM | POA: Diagnosis not present

## 2024-01-07 DIAGNOSIS — M545 Low back pain, unspecified: Secondary | ICD-10-CM | POA: Insufficient documentation

## 2024-01-07 DIAGNOSIS — R52 Pain, unspecified: Secondary | ICD-10-CM

## 2024-01-07 LAB — URINALYSIS, MICROSCOPIC ONLY

## 2024-01-07 LAB — POCT URINALYSIS DIPSTICK
Glucose, UA: NEGATIVE
Ketones, UA: NEGATIVE
Leukocytes, UA: NEGATIVE
Nitrite, UA: NEGATIVE
Protein, UA: POSITIVE — AB
Spec Grav, UA: >=1.03 — AB (ref 1.010–1.025)
Urobilinogen, UA: 0.2 U/dL
pH, UA: 5.5 (ref 5.0–8.0)

## 2024-01-07 MED ORDER — TAMSULOSIN HCL 0.4 MG PO CAPS: 0.4000 mg | ORAL_CAPSULE | Freq: Every day | ORAL | 0 refills | Status: DC

## 2024-01-07 MED ORDER — MELOXICAM 7.5 MG PO TABS: 7.5000 mg | ORAL_TABLET | Freq: Every day | ORAL | 0 refills | Status: DC | PRN

## 2024-01-07 NOTE — Assessment & Plan Note (Addendum)
 Patient with acute right sided low back discomfort.  Also has some urinary symptoms.  Discussed the potential for this being a muscular issue versus a kidney stone.  We we will get a KUB to evaluate for obvious stone.  If that workup is negative we will treat for muscular back discomfort.  Urinalysis with blood.  Will send urine for microscopy and culture.  Given the potential that this could be a kidney stone we will go ahead and start on Flomax 1 tablet daily.  I will also prescribe meloxicam 7.5 mg daily for him to try to help with his pain.  He will take this with food.  If this is not beneficial he will let us know.  Advised on the risk of lightheadedness with the Flomax.

## 2024-01-07 NOTE — Patient Instructions (Signed)
 Nice to see you. We are going to treat you for a kidney stone and for muscular strain.  Please start on the Flomax.  You can use the meloxicam once daily as needed.  Please take the meloxicam with food.  If it irritates your stomach please let us know. The Flomax could make you lightheaded.  If you start to get lightheaded after starting this please let us know. We are sending your urine for further evaluation and we will contact you with the results. We will contact you with the x-ray results as well.

## 2024-01-07 NOTE — Progress Notes (Signed)
 Marikay Alar, MD Phone: [phone]  Juan [name]. is a 73 y.o. male who presents today for same-day visit.  Right low back pain: Patient notes this started last Wednesday.  Notes it was sharp intermittent pain.  Its improved some over the weekend and notes it is a little bit of pain at the moment that is threatening to be worse per his report.  Notes a little bit of radiation to his right hip though it does not radiate further than that.  No known injury.  Does report some urinary frequency and urgency.  No hematuria.  No dysuria.  No kidney stone history.  No numbness.  He noted some weakness in his right leg when the severe pain would hit.  No incontinence.  No history of back pain like this.  Does report a history of arthritis in his back.  Social History   Tobacco Use  Smoking Status Every Day   Current packs/day: 0.50   Average packs/day: 0.6 packs/day for 106.6 years (66.6 ttl pk-yrs)   Types: Cigarettes   Start date: 05/24/1970  Smokeless Tobacco Never  Tobacco Comments   0.75 PPD- khj 07/13/2023    Current Outpatient Medications on File Prior to Visit  Medication Sig Dispense Refill   Iron, Ferrous Sulfate, 325 (65 Fe) MG TABS Take 325 mg by mouth every other day. (Patient not taking: Reported on 01/07/2024) 90 tablet 3   No current facility-administered medications on file prior to visit.     ROS see history of present illness  Objective  Physical Exam Vitals:   01/07/24 0907  BP: 120/80  Pulse: 81  Temp: 98.2 F (36.8 C)  SpO2: 97%    BP Readings from Last 3 Encounters:  01/07/24 120/80  07/20/23 116/68  07/13/23 120/70   Wt Readings from Last 3 Encounters:  01/07/24 172 lb 6.4 oz (78.2 kg)  07/20/23 162 lb 4 oz (73.6 kg)  07/13/23 163 lb 3.2 oz (74 kg)    Physical Exam Constitutional:      General: He is not in acute distress. Abdominal:     Tenderness: There is no right CVA tenderness or left CVA tenderness.  Musculoskeletal:     Comments:  No midline spine tenderness, no midline spine step-off, there is some lumbar muscular back tenderness on the right side  Neurological:     Mental Status: He is alert.     Comments: 5/5 strength bilateral quads, hamstrings, plantarflexion, and dorsiflexion, sensation to light touch intact bilateral lower extremities      Assessment/Plan: Please see individual problem list.  Acute right-sided low back pain without sciatica Assessment & Plan: Patient with acute right sided low back discomfort.  Also has some urinary symptoms.  Discussed the potential for this being a muscular issue versus a kidney stone.  We we will get a KUB to evaluate for obvious stone.  If that workup is negative we will treat for muscular back discomfort.  Urinalysis with blood.  Will send urine for microscopy and culture.  Given the potential that this could be a kidney stone we will go ahead and start on Flomax 1 tablet daily.  I will also prescribe meloxicam 7.5 mg daily for him to try to help with his pain.  He will take this with food.  If this is not beneficial he will let us know.  Advised on the risk of lightheadedness with the Flomax.  Orders: -     DG Abd 1 View; Future -  Urine Culture -     Urine Microscopic -     Tamsulosin HCl; Take 1 capsule (0.4 mg total) by mouth daily.  Dispense: 30 capsule; Refill: 0 -     Meloxicam; Take 1 tablet (7.5 mg total) by mouth daily as needed for pain. Take with food.  Dispense: 30 tablet; Refill: 0  Dysuria -     POCT urinalysis dipstick  Proteinuria, unspecified type -     Protein / creatinine ratio, urine   Patient was given a note for work to be out today, tomorrow, and Wednesday.  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  Return if symptoms worsen or fail to improve.   Marikay Alar, MD [facility]

## 2024-01-08 LAB — URINE CULTURE
MICRO NUMBER:: 16119951
Result:: NO GROWTH
SPECIMEN QUALITY:: ADEQUATE

## 2024-01-09 ENCOUNTER — Ambulatory Visit: Payer: Self-pay | Admitting: Family Medicine

## 2024-01-09 ENCOUNTER — Telehealth: Payer: Self-pay

## 2024-01-09 NOTE — Telephone Encounter (Signed)
 Patient states that he received a call yesterday saying that he needs to stop taking one of his pills. Patient is unsure as to which medication he needs to stop and wants to make sure which medication it is. Patient is requesting a call back. This RN reviewed notes and did not locate any provider notes or call back notes on which medication for patient to stop taking. Please contact patient.  Copied from CRM [id number]. Topic: Clinical - Medication Question >> Jan 09, 2024  4:51 PM Florestine Avers wrote: Reason for CRM: Patient states that he received a call yesterday saying that he needs to stop taking one of his pills. Patient is unsure as to which medication he needs to stop and wants to make sure which medication it is. Patient is requesting a call back. Reason for Disposition  [1] Follow-up call from patient regarding patient's clinical status AND [2] information NON-URGENT  Answer Assessment - Initial Assessment Questions 1. REASON FOR CALL or QUESTION: "What is your reason for calling today?" or "How can I best help you?" or "What question do you have that I can help answer?"     Patient states that he received a call yesterday saying that he needs to stop taking one of his pills. Patient is unsure as to which medication he needs to stop and wants to make sure which medication it is. Patient is requesting a call back. 2. CALLER: Document the source of call. (e.g., laboratory, patient).     Patient  Protocols used: PCP Call - No Triage-A-AH

## 2024-01-09 NOTE — Telephone Encounter (Signed)
 Copied from CRM [id number]. Topic: Clinical - Lab/Test Results >> Jan 09, 2024 11:31 AM [place] J wrote: Reason for CRM: Patient would like to know xray results. Please call patient at: [phone]

## 2024-01-10 ENCOUNTER — Telehealth: Payer: Self-pay

## 2024-01-10 NOTE — Telephone Encounter (Signed)
 Copied from CRM [id number]. Topic: Clinical - Medication Question >> Jan 09, 2024  4:51 PM Florestine Avers wrote: Reason for CRM: Patient states that he received a call yesterday saying that he needs to stop taking one of his pills. Patient is unsure as to which medication he needs to stop and wants to make sure which medication it is. Patient is requesting a call back. 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 Patient would like to be called

## 2024-01-10 NOTE — Telephone Encounter (Signed)
 Left message to return call to our office.  Pt would need an appointment to be evaluated to go back to work.

## 2024-01-11 NOTE — Telephone Encounter (Signed)
 Pt has been notified.

## 2024-01-16 NOTE — Telephone Encounter (Signed)
 Pt scheduled for 02/11/24

## 2024-02-11 ENCOUNTER — Encounter: Payer: Self-pay | Admitting: Family Medicine

## 2024-02-11 ENCOUNTER — Ambulatory Visit (INDEPENDENT_AMBULATORY_CARE_PROVIDER_SITE_OTHER): Payer: Medicare Other | Admitting: Family Medicine

## 2024-02-11 VITALS — BP 122/70 | HR 57 | Temp 97.8°F | Resp 20 | Ht 68.0 in | Wt 170.4 lb

## 2024-02-11 DIAGNOSIS — M5416 Radiculopathy, lumbar region: Secondary | ICD-10-CM

## 2024-02-11 DIAGNOSIS — E559 Vitamin D deficiency, unspecified: Secondary | ICD-10-CM | POA: Diagnosis not present

## 2024-02-11 DIAGNOSIS — N400 Enlarged prostate without lower urinary tract symptoms: Secondary | ICD-10-CM

## 2024-02-11 DIAGNOSIS — I700 Atherosclerosis of aorta: Secondary | ICD-10-CM | POA: Diagnosis not present

## 2024-02-11 DIAGNOSIS — K219 Gastro-esophageal reflux disease without esophagitis: Secondary | ICD-10-CM

## 2024-02-11 DIAGNOSIS — M1990 Unspecified osteoarthritis, unspecified site: Secondary | ICD-10-CM

## 2024-02-11 DIAGNOSIS — K5900 Constipation, unspecified: Secondary | ICD-10-CM

## 2024-02-11 LAB — LIPID PANEL
Cholesterol: 204 mg/dL — ABNORMAL HIGH (ref 0–200)
HDL: 82.8 mg/dL (ref >39.00)
LDL Cholesterol: 112 mg/dL — ABNORMAL HIGH (ref 0–99)
NonHDL: 121.57
Total CHOL/HDL Ratio: 2
Triglycerides: 48 mg/dL (ref 0.0–149.0)
VLDL: 9.6 mg/dL (ref 0.0–40.0)

## 2024-02-11 LAB — COMPREHENSIVE METABOLIC PANEL WITH GFR
ALT: 8 U/L (ref 0–53)
AST: 16 U/L (ref 0–37)
Albumin: 3.9 g/dL (ref 3.5–5.2)
Alkaline Phosphatase: 67 U/L (ref 39–117)
BUN: 13 mg/dL (ref 6–23)
CO2: 28 meq/L (ref 19–32)
Calcium: 9.1 mg/dL (ref 8.4–10.5)
Chloride: 107 meq/L (ref 96–112)
Creatinine, Ser: 0.8 mg/dL (ref 0.40–1.50)
GFR: 87.36 mL/min (ref >60.00)
Glucose, Bld: 86 mg/dL (ref 70–99)
Potassium: 4.8 meq/L (ref 3.5–5.1)
Sodium: 141 meq/L (ref 135–145)
Total Bilirubin: 0.5 mg/dL (ref 0.2–1.2)
Total Protein: 6.9 g/dL (ref 6.0–8.3)

## 2024-02-11 LAB — CBC WITH DIFFERENTIAL/PLATELET
Basophils Absolute: 0 10*3/uL (ref 0.0–0.1)
Basophils Relative: 0.4 % (ref 0.0–3.0)
Eosinophils Absolute: 0.1 10*3/uL (ref 0.0–0.7)
Eosinophils Relative: 1.3 % (ref 0.0–5.0)
HCT: 38.8 % — ABNORMAL LOW (ref 39.0–52.0)
Hemoglobin: 13.1 g/dL (ref 13.0–17.0)
Lymphocytes Relative: 28.1 % (ref 12.0–46.0)
Lymphs Abs: 1.7 10*3/uL (ref 0.7–4.0)
MCHC: 33.9 g/dL (ref 30.0–36.0)
MCV: 99 fl (ref 78.0–100.0)
Monocytes Absolute: 0.8 10*3/uL (ref 0.1–1.0)
Monocytes Relative: 12.4 % — ABNORMAL HIGH (ref 3.0–12.0)
Neutro Abs: 3.5 10*3/uL (ref 1.4–7.7)
Neutrophils Relative %: 57.8 % (ref 43.0–77.0)
Platelets: 162 10*3/uL (ref 150.0–400.0)
RBC: 3.92 Mil/uL — ABNORMAL LOW (ref 4.22–5.81)
RDW: 15.1 % (ref 11.5–15.5)
WBC: 6.1 10*3/uL (ref 4.0–10.5)

## 2024-02-11 MED ORDER — ACETAMINOPHEN ER 650 MG PO TBCR: 650.0000 mg | EXTENDED_RELEASE_TABLET | Freq: Three times a day (TID) | ORAL | 1 refills | Status: DC | PRN

## 2024-02-11 MED ORDER — PANTOPRAZOLE SODIUM 40 MG PO TBEC: 40.0000 mg | DELAYED_RELEASE_TABLET | Freq: Every day | ORAL | 0 refills | Status: DC

## 2024-02-11 MED ORDER — POLYETHYLENE GLYCOL 3350 17 GM/SCOOP PO POWD: 17.0000 g | Freq: Two times a day (BID) | ORAL | 1 refills | Status: DC | PRN

## 2024-02-11 MED ORDER — SENNOSIDES 8.6 MG PO TABS: 1.0000 | ORAL_TABLET | Freq: Every day | ORAL | 1 refills | Status: DC

## 2024-02-11 NOTE — Patient Instructions (Addendum)
 It was a pleasure meeting you today. Thank you for allowing me to take part in your health care.  Our goals for today as we discussed include:  Start Miralax 1 scoop two times a day Start Senna 1 tablet at night Increase water intake Increase fiber intake  Start Tylenol Arthritis two times a day for pain Continue to stay mobile   Continue Flomax 0.4 mg daily  Start Protonix 40 mg daily for stomach Avoid NSAIDs, Meloxicam, Ibuprofen, Aleve. Goodie Powder as this can increase stomach ulcers   We will get some labs today.  If they are abnormal or we need to do something about them, I will call you.  If they are normal, I will send you a message on MyChart (if it is active) or a letter in the mail.  If you don't hear from [place] in 2 weeks, please call the office at the number below.   Follow up in 4 days    This is a list of the screening recommended for you and due dates:  Health Maintenance  Topic Date Due   Pneumonia Vaccine (1 of 2 - PCV) Never done   DTaP/Tdap/Td vaccine (1 - Tdap) Never done   Zoster (Shingles) Vaccine (1 of 2) Never done   COVID-19 Vaccine (3 - 2024-25 season) 07/15/2023   Medicare Annual Wellness Visit  04/01/2024   Flu Shot  02/11/2024*   Screening for Lung Cancer  03/19/2024   Colon Cancer Screening  02/08/2030   Hepatitis C Screening  Completed   HPV Vaccine  Aged Out  *Topic was postponed. The date shown is not the original due date.     If you have any questions or concerns, please do not hesitate to call the office at [phone].  I look forward to our next visit and until then take care and stay safe.  Regards,   Dana Allan, MD   [facility]

## 2024-02-11 NOTE — Progress Notes (Unsigned)
 SUBJECTIVE:   Chief Complaint  Patient presents with   Hypertension    Follow up   Abdominal Pain    No pain just feels like something is in the stomach 2-3 weeks off and on.   HPI Presents for follow up   Discussed the use of AI scribe software for clinical note transcription with the patient, who gave verbal consent to proceed.  History of Present Illness Juan Dartanyan Deasis. is a 73 year old male who presents with side pain and constipation.  He attributes his side pain to pre-existing arthritis, which is less severe than before but affects his mobility, especially after prolonged sitting. He experiences stiffness when getting up after sitting for a long time, such as watching TV. The pain has persisted since 2019 without significant improvement. Movement alleviates the pain, and he manages it with over-the-counter medications like Tylenol, avoiding NSAIDs due to previous erosions found in his esophagus and stomach.  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 Bowel movements vary in size. He is taking iron tablets, which can contribute to constipation. He has not been taking Flomax regularly, which was prescribed to help with urinary and bowel issues. He feels like there is something in his stomach, ongoing for about three weeks, but denies any pain, nausea, or vomiting. He eats irregularly, often consuming junk food, and passes gas occasionally.  He experiences occasional difficulty with urination but denies a weak stream. He has not had a bowel movement in three days.    PERTINENT PMH / PSH: As above  OBJECTIVE:  BP 122/70   Pulse (!) 57   Temp 97.8 F (36.6 C)   Resp 20   Ht 5\' 8"  (1.727 m)   Wt 170 lb 7 oz (77.3 kg)   SpO2 98%   BMI 25.91 kg/m    Physical Exam Vitals reviewed.  Constitutional:      General: He is not in acute distress.    Appearance: Normal appearance. He is  normal weight. He is not ill-appearing, toxic-appearing or diaphoretic.  Eyes:     General:        Right eye: No discharge.        Left eye: No discharge.  Cardiovascular:     Rate and Rhythm: Normal rate and regular rhythm.     Heart sounds: Normal heart sounds.  Pulmonary:     Effort: Pulmonary effort is normal.     Breath sounds: Normal breath sounds.  Abdominal:     General: Abdomen is protuberant. Bowel sounds are decreased. There is no distension.     Palpations: Abdomen is soft. There is no fluid wave, hepatomegaly or splenomegaly.     Tenderness: There is no abdominal tenderness. There is no right CVA tenderness or left CVA tenderness. Negative signs include Murphy's sign, Rovsing's sign, McBurney's sign and psoas sign.  Musculoskeletal:        General: Normal range of motion.     Cervical back: Normal range of motion.  Skin:    General: Skin is warm and dry.  Neurological:     Mental Status: He is alert and oriented to person, place, and time. Mental status is at baseline.  Psychiatric:        Mood and Affect: Mood normal.        Behavior: Behavior normal.        Thought Content: Thought content normal.  Judgment: Judgment normal.    {Perform Simple Foot Exam  Perform Detailed exam:1} {Insert foot Exam (Optional):30965}      07/20/2023    8:42 AM 06/04/2023    2:09 PM 04/02/2023   11:17 AM 03/12/2023   11:44 AM 08/10/2021   12:49 PM  Depression screen PHQ 2/9  Decreased Interest 0 0 0 0 0  Down, Depressed, Hopeless 0 0 1 0 0  PHQ - 2 Score 0 0 1 0 0  Altered sleeping 0      Tired, decreased energy 0      Change in appetite 0      Feeling bad or failure about yourself  0      Trouble concentrating 0      Moving slowly or fidgety/restless 0      Suicidal thoughts 0      PHQ-9 Score 0      Difficult doing work/chores Not difficult at all          07/20/2023    8:42 AM 06/04/2023    2:09 PM 05/18/2020    1:54 PM  GAD 7 : Generalized Anxiety Score  Nervous,  Anxious, on Edge 0 0 0  Control/stop worrying 0 0 0  Worry too much - different things 0 0 0  Trouble relaxing 0 0 0  Restless 0 0 0  Easily annoyed or irritable 0 0 0  Afraid - awful might happen 0 0 0  Total GAD 7 Score 0 0 0  Anxiety Difficulty Not difficult at all  Not difficult at all    ASSESSMENT/PLAN:  There are no diagnoses linked to this encounter.  Assessment and Plan Assessment & Plan Chronic Constipation Infrequent bowel movements every three to four days with moderate stool burden on abdominal x-ray. Likely exacerbated by iron supplementation and dietary habits. Denies nausea, vomiting, or significant abdominal pain but reports abdominal fullness. Constipation suspected to contribute to abdominal discomfort. - Recommend daily Miralax to promote regular bowel movements. - Advise using Senna every other night initially to avoid diarrhea. - Increase water and fiber intake. - Encourage physical activity. - Reassess in one week if symptoms do not improve. - Consider more potent treatment if no improvement.  Osteoarthritis Osteoarthritis affecting one side, causing stiffness and pain, especially after prolonged sitting. Present since 2019, affecting daily activities. Movement alleviates pain. Non-NSAID pain management discussed due to previous esophageal erosions. - Recommend daily use of extra strength Tylenol for pain management. - Suggest diclofenac gel for topical pain relief. - Offer referral for physical therapy, although declined at this time.  Urinary Hesitancy Occasional difficulty with urination, including hesitancy, but denies a weak stream. Previously prescribed Flomax but not taken regularly. Suggested trial of regular Flomax use to assess effectiveness. - Trial regular use of Flomax to assess improvement in urinary symptoms.      PDMP reviewed***  No follow-ups on file.  Dana Allan, MD

## 2024-02-15 ENCOUNTER — Encounter: Payer: Self-pay | Admitting: Family Medicine

## 2024-02-15 ENCOUNTER — Ambulatory Visit (INDEPENDENT_AMBULATORY_CARE_PROVIDER_SITE_OTHER): Admitting: Family Medicine

## 2024-02-15 VITALS — BP 122/70 | HR 76 | Temp 97.7°F | Resp 20 | Ht 68.0 in | Wt 165.2 lb

## 2024-02-15 DIAGNOSIS — K5900 Constipation, unspecified: Secondary | ICD-10-CM | POA: Diagnosis not present

## 2024-02-15 DIAGNOSIS — E785 Hyperlipidemia, unspecified: Secondary | ICD-10-CM | POA: Diagnosis not present

## 2024-02-15 DIAGNOSIS — K219 Gastro-esophageal reflux disease without esophagitis: Secondary | ICD-10-CM | POA: Insufficient documentation

## 2024-02-15 DIAGNOSIS — I700 Atherosclerosis of aorta: Secondary | ICD-10-CM | POA: Diagnosis not present

## 2024-02-15 HISTORY — DX: Gastro-esophageal reflux disease without esophagitis: K21.9

## 2024-02-15 HISTORY — DX: Constipation, unspecified: K59.00

## 2024-02-15 MED ORDER — ATORVASTATIN CALCIUM 40 MG PO TABS: 40.0000 mg | ORAL_TABLET | Freq: Every day | ORAL | 3 refills | Status: DC

## 2024-02-15 NOTE — Assessment & Plan Note (Signed)
 Infrequent bowel movements every three to four days with moderate stool burden on abdominal x-ray. Likely exacerbated by iron supplementation and dietary habits. Denies nausea, vomiting, or significant abdominal pain but reports abdominal fullness. Constipation suspected to contribute to abdominal discomfort.  - Recommend daily Miralax to promote regular bowel movements. - Advise using Senna every other night initially to avoid diarrhea. - Increase water and fiber intake. - Encourage physical activity. - Reassess in one week if symptoms do not improve.

## 2024-02-15 NOTE — Assessment & Plan Note (Signed)
 Osteoarthritis affecting one side, causing stiffness and pain, especially after prolonged sitting. Present since 2019, affecting daily activities. Movement alleviates pain. Non-NSAID pain management discussed due to previous esophageal erosions. - Recommend daily use of extra strength Tylenol for pain management. - Suggest diclofenac gel for topical pain relief. - Offer referral for physical therapy, although declined at this time.

## 2024-02-15 NOTE — Assessment & Plan Note (Signed)
 Refill Protonix

## 2024-02-15 NOTE — Assessment & Plan Note (Signed)
 Occasional difficulty with urination, including hesitancy, but denies a weak stream. Previously prescribed Flomax but not taken regularly. Suggested trial of regular Flomax use to assess effectiveness. - Trial regular use of Flomax to assess improvement in urinary symptoms.

## 2024-02-15 NOTE — Progress Notes (Signed)
 SUBJECTIVE:   Chief Complaint  Patient presents with   Medical Management of Chronic Issues    4 days follow up   HPI Presents for follow up abdominal pain  Discussed the use of AI scribe software for clinical note transcription with the patient, who gave verbal consent to proceed.  History of Present Illness Juan [name]. is a 73 year old male who presents with constipation.  He did not experience relief from constipation after taking previously prescribed medications. On Wednesday, he decided to use OTC milk of magnesia. He began taking it last night and this morning, which has resulted in improvement of his symptoms. He feels better with no current abdominal discomfort and has had a good bowel movement. He no longer feels the sensation of incomplete evacuation in his abdomen.   No current abdominal discomfort and confirms having a good bowel movement.  He mentions that he has a dog who travels with him in his truck, indicating a mobile lifestyle. He engages in physical activities such as yard work and has made accommodations for his dog, such as ordering a seat for him. He describes his dog as energetic and notes that he has been trying to train him.    PERTINENT PMH / PSH: As above  OBJECTIVE:  BP 122/70   Pulse 76   Temp 97.7 F (36.5 C)   Resp 20   Ht 5\' 8"  (1.727 m)   Wt 165 lb 4 oz (75 kg)   SpO2 97%   BMI 25.13 kg/m    Physical Exam Vitals reviewed.  Constitutional:      General: He is not in acute distress.    Appearance: Normal appearance. He is normal weight. He is not ill-appearing, toxic-appearing or diaphoretic.  Eyes:     General:        Right eye: No discharge.        Left eye: No discharge.  Cardiovascular:     Rate and Rhythm: Normal rate and regular rhythm.     Heart sounds: Normal heart sounds.  Pulmonary:     Effort: Pulmonary effort is normal.     Breath sounds: Normal breath sounds.  Abdominal:     General: Bowel sounds are normal.  There is no distension.     Palpations: Abdomen is soft.     Tenderness: There is no abdominal tenderness. There is no right CVA tenderness, left CVA tenderness or guarding.  Musculoskeletal:        General: Normal range of motion.     Cervical back: Normal range of motion.  Skin:    General: Skin is warm and dry.  Neurological:     Mental Status: He is alert and oriented to person, place, and time. Mental status is at baseline.  Psychiatric:        Mood and Affect: Mood normal.        Behavior: Behavior normal.        Thought Content: Thought content normal.        Judgment: Judgment normal.           02/15/2024    1:16 PM 02/11/2024    9:49 AM 07/20/2023    8:42 AM 06/04/2023    2:09 PM 04/02/2023   11:17 AM  Depression screen PHQ 2/9  Decreased Interest 0 0 0 0 0  Down, Depressed, Hopeless 0 0 0 0 1  PHQ - 2 Score 0 0 0 0 1  Altered sleeping 0 0 0  Tired, decreased energy 0 0 0    Change in appetite 0 0 0    Feeling bad or failure about yourself  0 0 0    Trouble concentrating 0 0 0    Moving slowly or fidgety/restless 0 0 0    Suicidal thoughts 0 0 0    PHQ-9 Score 0 0 0    Difficult doing work/chores Not difficult at all Not difficult at all Not difficult at all        02/15/2024    1:16 PM 02/11/2024    9:49 AM 07/20/2023    8:42 AM 06/04/2023    2:09 PM  GAD 7 : Generalized Anxiety Score  Nervous, Anxious, on Edge 0 0 0 0  Control/stop worrying 0 0 0 0  Worry too much - different things 0 0 0 0  Trouble relaxing 0 0 0 0  Restless 0 0 0 0  Easily annoyed or irritable 0 0 0 0  Afraid - awful might happen 0 0 0 0  Total GAD 7 Score 0 0 0 0  Anxiety Difficulty Not difficult at all Not difficult at all Not difficult at all     ASSESSMENT/PLAN:  Constipation, unspecified constipation type Assessment & Plan: Chronic constipation improved with milk of magnesia. Weight loss attributed to recent bowel movements and activity. No current abdominal pain. - Continue milk  of magnesia for 1-2 days, reassess. - Advise against daily use of milk of magnesia. - Recommend Miralax every other day or every third day. - Monitor bowel movements, avoid prolonged periods without defecation. - Provide note for return to work on Monday. - Contact if abdominal pain, diarrhea, or inability to return to work on Monday.   Hyperlipidemia, unspecified hyperlipidemia type Assessment & Plan: Refill Lipitor 40 mg daily Continue to modify diet and increase activity as tolerated  Orders: -     Atorvastatin Calcium; Take 1 tablet (40 mg total) by mouth daily.  Dispense: 90 tablet; Refill: 3  Aortic atherosclerosis (HCC) -     Atorvastatin Calcium; Take 1 tablet (40 mg total) by mouth daily.  Dispense: 90 tablet; Refill: 3     PDMP reviewed  Return if symptoms worsen or fail to improve, for PCP.  Valli Gaw, MD

## 2024-02-15 NOTE — Patient Instructions (Signed)
 It was a pleasure meeting you today. Thank you for allowing me to take part in your health care.  Our goals for today as we discussed include:  Glad you are feeling better  You should not go longer than 3 days without moving your bowels  Continue current over the counter mag citrate today and tomorrow.  Increase water and activity to maintain good bowel habits   This is a list of the screening recommended for you and due dates:  Health Maintenance  Topic Date Due   Pneumonia Vaccine (1 of 2 - PCV) Never done   DTaP/Tdap/Td vaccine (1 - Tdap) Never done   Zoster (Shingles) Vaccine (1 of 2) Never done   COVID-19 Vaccine (3 - 2024-25 season) 07/15/2023   Medicare Annual Wellness Visit  04/01/2024   Screening for Lung Cancer  03/19/2024   Flu Shot  06/13/2024   Colon Cancer Screening  02/08/2030   Hepatitis C Screening  Completed   HPV Vaccine  Aged Out      If you have any questions or concerns, please do not hesitate to call the office at [phone].  I look forward to our next visit and until then take care and stay safe.  Regards,   Dana Allan, MD   [facility]

## 2024-02-19 ENCOUNTER — Telehealth: Payer: Self-pay

## 2024-02-19 NOTE — Telephone Encounter (Signed)
 Left message to return call to our office.

## 2024-02-19 NOTE — Telephone Encounter (Signed)
 Copied from CRM [id number]. Topic: General - Other >> Feb 19, 2024 10:35 AM Elizebeth Brooking wrote: Reason for CRM: Patient called in stating he had a missed call, would like for whoever to call to call back.   Patient also wanted to speak with Dr.Walsh about how he can go about making his dog an emotional support animal, would like a callback on this as well.

## 2024-02-26 NOTE — Telephone Encounter (Signed)
 Left message to return call to our office.

## 2024-02-27 ENCOUNTER — Encounter: Payer: Self-pay | Admitting: Family Medicine

## 2024-02-27 NOTE — Assessment & Plan Note (Addendum)
 Chronic constipation improved with milk of magnesia. Weight loss attributed to recent bowel movements and activity. No current abdominal pain. - Continue milk of magnesia for 1-2 days, reassess. - Advise against daily use of milk of magnesia. - Recommend Miralax every other day or every third day. - Monitor bowel movements, avoid prolonged periods without defecation. - Provide note for return to work on Monday. - Contact if abdominal pain, diarrhea, or inability to return to work on Monday.

## 2024-02-27 NOTE — Assessment & Plan Note (Signed)
 Refill Lipitor 40 mg daily Continue to modify diet and increase activity as tolerated

## 2024-03-04 NOTE — Telephone Encounter (Signed)
 Left message to return call to our office.

## 2024-04-14 ENCOUNTER — Ambulatory Visit (INDEPENDENT_AMBULATORY_CARE_PROVIDER_SITE_OTHER): Payer: Medicare Other | Admitting: *Deleted

## 2024-04-14 ENCOUNTER — Telehealth: Payer: Self-pay | Admitting: *Deleted

## 2024-04-14 VITALS — Ht 68.0 in | Wt 169.0 lb

## 2024-04-14 DIAGNOSIS — Z0000 Encounter for general adult medical examination without abnormal findings: Secondary | ICD-10-CM

## 2024-04-14 NOTE — Telephone Encounter (Signed)
 Called patient and performed his AWV While reviewing his medications patient stated hat he stopped taking his Lipitor probably over a year ago.
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
## 2024-04-14 NOTE — Patient Instructions (Signed)
 Juan [name] , Thank you for taking time out of your busy schedule to complete your Annual Wellness Visit with me. I enjoyed our conversation and look forward to speaking with you again next year. I, as well as your care team,  appreciate your ongoing commitment to your health goals. Please review the following plan we discussed and let me know if I can assist you in the future. Your Game plan/ To Do List    Referrals: If you haven't heard from the office you've been referred to, please reach out to them at the phone provided.    Remember to call and schedule your lung cancer screening and schedule an eye exam. Consider updating your vaccines.  Follow up Visits: Next Medicare AWV with our clinical staff: 04/15/25 @ 10:50   Have you seen your provider in the last 6 months (3 months if uncontrolled diabetes)? Yes Next Office Visit with your provider: 08/11/24  Clinician Recommendations:  Aim for 30 minutes of exercise or brisk walking, 6-8 glasses of water, and 5 servings of fruits and vegetables each day.       This is a list of the screening recommended for you and due dates:  Health Maintenance  Topic Date Due   DTaP/Tdap/Td vaccine (1 - Tdap) Never done   Pneumonia Vaccine (1 of 2 - PCV) Never done   Zoster (Shingles) Vaccine (1 of 2) Never done   COVID-19 Vaccine (3 - 2024-25 season) 07/15/2023   Screening for Lung Cancer  03/19/2024   Flu Shot  06/13/2024   Medicare Annual Wellness Visit  04/14/2025   Colon Cancer Screening  02/08/2030   Hepatitis C Screening  Completed   HPV Vaccine  Aged Out   Meningitis B Vaccine  Aged Out    Advanced directives: (Declined) Advance directive discussed with you today. Even though you declined this today, please call our office should you change your mind, and we can give you the proper paperwork for you to fill out. Advance Care Planning is important because it:  [x]  Makes sure you receive the medical care that is consistent with your values,  goals, and preferences  [x]  It provides guidance to your family and loved ones and reduces their decisional burden about whether or not they are making the right decisions based on your wishes.

## 2024-04-15 NOTE — Telephone Encounter (Signed)
 Left message to return call to our office.  To relay message from provider. Please document when spoke to.

## 2024-06-09 ENCOUNTER — Encounter: Payer: Self-pay | Admitting: Primary Care

## 2024-06-09 ENCOUNTER — Ambulatory Visit: Admitting: Primary Care

## 2024-06-09 VITALS — BP 107/64 | HR 48 | Ht 68.5 in | Wt 164.4 lb

## 2024-06-09 DIAGNOSIS — G4733 Obstructive sleep apnea (adult) (pediatric): Secondary | ICD-10-CM
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
## 2024-06-09 NOTE — Patient Instructions (Addendum)
-   You are compliant with CPAP but needs to increase usage time - If you are having issues getting supplies from [facility], you can purchase on your own on Amazon or [email or web address] - We provided you with a fischer and paykel small full face mask today    ________________________________________________________________________   VISIT SUMMARY: Today, we discussed your ongoing issues with using your CPAP machine, which is essential for managing your sleep apnea. You mentioned that the tubing and filter need replacement and that the mask and tubing often detach during sleep. 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 You have experienced significant weight loss recently and prefer not to take medication to aid sleep.  YOUR PLAN: -OBSTRUCTIVE SLEEP APNEA: Obstructive sleep apnea is a condition where your airway becomes blocked during sleep, causing breathing pauses. Your sleep study showed mild sleep apnea overall but severe during REM sleep. To improve your CPAP compliance, we will order new tubing, mask, and filter from [facility]. You should use the CPAP even when not sleeping to achieve the required four hours of usage. Please document any compliance issues related to your work schedule and equipment malfunction, and contact [facility] for delivery instructions.  INSTRUCTIONS: Please follow up with [facility] to ensure the new CPAP equipment is delivered. Continue using your CPAP machine as instructed, even when not sleeping, to meet the compliance requirements for your job. If you experience any further issues, please schedule another appointment.  Follow-up 6 weeks for compliance check/ must get 4 hours of usage per day

## 2024-06-16 ENCOUNTER — Telehealth: Payer: Self-pay

## 2024-06-16 NOTE — Telephone Encounter (Signed)
 Copied from CRM [id number]. Topic: Clinical - Order For Equipment >> Jun 16, 2024  8:34 AM Juan [name] wrote: Reason for CRM: Patient is requesting to speak to someone about his CPAP machine - states he doesn't meet the requirements but needs his machine.   Callback number: [phone]    Tried to call patient twice no answer VM full ( [name] order for [name] new Cpap machine was placed on 06/09/24 to [facility] ).  NFN-

## 2024-06-18 ENCOUNTER — Telehealth: Payer: Self-pay

## 2024-06-18 NOTE — Telephone Encounter (Signed)
 Copied from CRM [id number]. Topic: Clinical - Order For Equipment >> Jun 16, 2024  8:34 AM Isabell A wrote: Reason for CRM: Patient is requesting to speak to someone about his CPAP machine - states he doesn't meet the requirements but needs his machine.   Callback number: [phone] >> Jun 16, 2024  5:19 PM Corin V wrote: Patient is returning missed call from [place]. [facility] is stating he does not meet criteria for being supplied with a CPAP and they are asking for the CPAP back. He is wanting to see if Burnard can call and get the issue figured out since [facility] is not explaining the issue to him or send him new supplies for almost a year now.  If [facility] will not supply the CPAP what other options are there?    Tried to call patient back no answer / no way to LM  - (spoke to [facility]  they just need a compliance report and face to face and things should be okay to run supplies through ins.)   NFN-

## 2024-06-18 NOTE — Telephone Encounter (Signed)
 Spoke w/ PT explained - PT has not been compliant  so ins will not pay for supplies , he can pay out of pocket or set up an ppt w/ provider after cpap use for so many days etc    NFN

## 2024-07-17 ENCOUNTER — Telehealth: Payer: Self-pay

## 2024-07-17 DIAGNOSIS — G4733 Obstructive sleep apnea (adult) (pediatric): Secondary | ICD-10-CM

## 2024-07-17 NOTE — Telephone Encounter (Signed)
 Copied from CRM [id number]. Topic: General - Other >> Jul 17, 2024 12:01 PM Abigail D wrote: Reason for CRM: Patient is returning a call from [facility] regarding his CPAP percentage, patient will wait for a call back as she was not available at the time of call. >> Jul 17, 2024  2:30 PM Russell PARAS wrote: Pt is returning call from [employer] concerning his requested CPAP percentages. Contacted CAL; however, Ashlynn was not avail.   Requested call back   CB#  [phone], his voicemail is full, so please call again if he doesn't answer.    Duplicate message not needed.

## 2024-07-17 NOTE — Telephone Encounter (Signed)
 Copied from CRM [id number]. 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 He is requesting if the information can be copied and he can pick up the information from the clinic.  CB#  [phone]- his voicemail is full, so if he doesn't answer, pt requests calling back.   I printed a DL for pt. It does not show that pt is using the machine and does not show much compliance. I tried calling pt X2 and could not leave a vm due to vb being full.

## 2024-07-18 NOTE — Telephone Encounter (Signed)
 ATC X1. LMTCB

## 2024-07-21 NOTE — Telephone Encounter (Signed)
 LMTCB

## 2024-07-22 NOTE — Telephone Encounter (Signed)
 Called the pt and there was no answer- LMTCB and closing per protocol. There have been multiple attempts to reach this pt without success.

## 2024-07-22 NOTE — Telephone Encounter (Signed)
 duplicate

## 2024-07-23 NOTE — Addendum Note (Signed)
 Addended by: HOPE ALMARIE ORN on: 07/23/2024 04:59 PM   Modules accepted: Orders

## 2024-07-23 NOTE — Telephone Encounter (Signed)
 Copied from CRM [id number]. Topic: Clinical - Medical Advice >> Jul 23, 2024 10:19 AM Isabell A wrote: Reason for CRM: Patient is wanting to know why he needs to give his CPAP machine to [facility] - he wants to speak to someone directly in the office.    CB# [phone]- his voicemail is full, so if he doesn't answer, pt requests calling back. >> Jul 23, 2024 12:50 PM Lavanda D wrote: Patient calling again to request a call back, clinic on lunch. Please call back when available.    ATC x2.  We have tried contacting this patient multiple times and have ben unable to reach him.

## 2024-07-23 NOTE — Telephone Encounter (Signed)
 Mild-moderate obstructive sleep apnea, last sleep study 2024. During patient's lat visit he was compliant with CPAP usage 90% of the times but struggles to get 4 hours of usage per night due to equipment issues including broken tubing and mask. 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   Compliance report 06/23/24 -9/925 Usage 2 hours 47 mins Pressure 9cm h20 Airleaks 74L/min  AHI 0.3   CPAP order placed

## 2024-07-23 NOTE — Telephone Encounter (Signed)
 Called and spoke with the patient. Pt states he needs a new cpap machine and supplies. Per 9/4 encounter download shows that the pt is not using the machine. Pt states he needs DL information so he can return to work.  Pt also said he wore cpap machine last night but he is having trouble with it and would like a new one.  I have put pt download on BW desk for her to look at.   Beth can you please advise

## 2024-07-24 ENCOUNTER — Telehealth: Payer: Self-pay

## 2024-07-24 ENCOUNTER — Telehealth: Payer: Self-pay | Admitting: Primary Care

## 2024-07-24 NOTE — Telephone Encounter (Signed)
 Copied from CRM #[id number]. Topic: General - Other >> Jul 24, 2024 10:02 AM Russell PARAS wrote: Reason for CRM:   Pt is contacting office, due to being advised he would receive at a call. Pt is needing a copy of CPAP compliance report for DOT physical, if this can be ready by his appt on 9/15 w/[facility], due to office being an hour away from his home and prevent additional trips to the clinic.  CB#  [phone]   Routing to the [employer].

## 2024-07-24 NOTE — Telephone Encounter (Signed)
 Per Tommye at [facility]- Patient isn't eligible for a new machine until 11/01/2026, supplies are keyed for a lifetime and pressure settings are the same. Does he need anything else?

## 2024-07-25 NOTE — Telephone Encounter (Signed)
 He just needs supplies- including mask, tubing and filters

## 2024-07-25 NOTE — Telephone Encounter (Signed)
NA. Mail box is full 

## 2024-07-25 NOTE — Telephone Encounter (Signed)
 Sent to DME NFN

## 2024-07-28 ENCOUNTER — Telehealth: Payer: Self-pay

## 2024-07-28 ENCOUNTER — Ambulatory Visit: Admitting: Sleep Medicine

## 2024-07-28 ENCOUNTER — Encounter: Payer: Self-pay | Admitting: Sleep Medicine

## 2024-07-28 VITALS — BP 120/80 | HR 63 | Temp 97.8°F | Ht 68.0 in | Wt 167.8 lb

## 2024-07-28 DIAGNOSIS — E785 Hyperlipidemia, unspecified: Secondary | ICD-10-CM | POA: Diagnosis not present

## 2024-07-28 DIAGNOSIS — G4733 Obstructive sleep apnea (adult) (pediatric): Secondary | ICD-10-CM | POA: Diagnosis not present

## 2024-07-28 DIAGNOSIS — E782 Mixed hyperlipidemia: Secondary | ICD-10-CM

## 2024-07-28 NOTE — Telephone Encounter (Signed)
 Spoke with [facility] to see if compliance data or pt contact has been made with regard to his machine and they have notes documenting that he is not currently compliant with his CPAP and is ineligible for receiving supplies via [facility]. He must demonstrate 30 days of consistent use which has not been shown.   They have an office note from Almarie Ferrari, NP on file that showed the patient was non-compliant at that time as well.   The data and compliance report generated for today's visit with Dr. Jess showed the same information of the patient not currently using his CPAP machine.

## 2024-07-28 NOTE — Telephone Encounter (Signed)
 Patient was seen in the office today by Dr. Jess.  Nothing further needed.
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
## 2024-07-29 ENCOUNTER — Telehealth: Payer: Self-pay

## 2024-07-29 NOTE — Telephone Encounter (Signed)
 Copied from CRM [id number]. Topic: General - Other >> Jul 29, 2024 12:33 PM Essie A wrote: Reason for CRM: Patient is calling to find out the status for his CPAP supplies.  He also needs a note to verify that he will be out of work.  He may need to get on unemployment.  Please return his call at [phone].  Thanks.  Atc x1. lmtcb

## 2024-07-30 NOTE — Telephone Encounter (Signed)
 Pt states he has been using his machine. 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 I informed pt that our office and [facility] both are not finding any data from the machine and he would have to take it to [facility] or them to look at it. Pt stated he did not want to take it to [facility] due to them wanting to take it back from him since it shows he is not using it. I informed pt that if he has an SD and, I may can see if it will pull data. Pt states he will come by tomorrow to see if I can look at his machine as he does not know if there is an SD card inside. NFN until I see pt tomorrow.

## 2024-07-30 NOTE — Telephone Encounter (Signed)
 Calling cal to see if someone is available to speak with patient . Speaking with Juan [name] to see if a nurse is available to speak with patient . No one was available . Please give patient a call back concerning cpap and work note  [phone]

## 2024-07-30 NOTE — Telephone Encounter (Signed)
 Patient is calling back because someone was suppose to reach back out to him concerning his cpap supplies and a note for work . Calling cal to see if someone is available

## 2024-07-31 ENCOUNTER — Telehealth: Payer: Self-pay

## 2024-07-31 NOTE — Telephone Encounter (Signed)
 Pt presented in office with his machine regarding our conversation yesterday. I called [facility] and spoke to Sam. Sam stated that they can not take his machine from him as it is paid for. Sam also stated that he needs to go to his Local [facility] and does not need an appt and ask for a SD card or [facility] and [facility] one to [facility] inside. Sam stated it may be his wifi that is not connecting his data which could also be a problem. I am not sure if pt has 30 more days to collect data for his DOT test.   ATC X1. LMTCB

## 2024-08-08 ENCOUNTER — Other Ambulatory Visit: Payer: Self-pay | Admitting: Acute Care

## 2024-08-08 DIAGNOSIS — Z87891 Personal history of nicotine dependence: Secondary | ICD-10-CM

## 2024-08-08 DIAGNOSIS — F17210 Nicotine dependence, cigarettes, uncomplicated: Secondary | ICD-10-CM

## 2024-08-11 ENCOUNTER — Ambulatory Visit (INDEPENDENT_AMBULATORY_CARE_PROVIDER_SITE_OTHER)

## 2024-08-11 VITALS — BP 120/70 | HR 54 | Temp 98.4°F | Ht 68.5 in | Wt 168.8 lb

## 2024-08-11 DIAGNOSIS — L989 Disorder of the skin and subcutaneous tissue, unspecified: Secondary | ICD-10-CM | POA: Diagnosis not present

## 2024-08-11 DIAGNOSIS — D509 Iron deficiency anemia, unspecified: Secondary | ICD-10-CM

## 2024-08-11 DIAGNOSIS — E782 Mixed hyperlipidemia: Secondary | ICD-10-CM

## 2024-08-11 DIAGNOSIS — N400 Enlarged prostate without lower urinary tract symptoms: Secondary | ICD-10-CM

## 2024-08-11 DIAGNOSIS — E785 Hyperlipidemia, unspecified: Secondary | ICD-10-CM

## 2024-08-11 DIAGNOSIS — J432 Centrilobular emphysema: Secondary | ICD-10-CM

## 2024-08-11 DIAGNOSIS — Z720 Tobacco use: Secondary | ICD-10-CM

## 2024-08-11 DIAGNOSIS — K5900 Constipation, unspecified: Secondary | ICD-10-CM

## 2024-08-11 DIAGNOSIS — R7309 Other abnormal glucose: Secondary | ICD-10-CM | POA: Insufficient documentation

## 2024-08-11 DIAGNOSIS — M1990 Unspecified osteoarthritis, unspecified site: Secondary | ICD-10-CM | POA: Diagnosis not present

## 2024-08-11 DIAGNOSIS — I700 Atherosclerosis of aorta: Secondary | ICD-10-CM | POA: Diagnosis not present

## 2024-08-11 DIAGNOSIS — M545 Low back pain, unspecified: Secondary | ICD-10-CM

## 2024-08-11 DIAGNOSIS — G4733 Obstructive sleep apnea (adult) (pediatric): Secondary | ICD-10-CM

## 2024-08-11 DIAGNOSIS — K219 Gastro-esophageal reflux disease without esophagitis: Secondary | ICD-10-CM

## 2024-08-11 MED ORDER — ACETAMINOPHEN ER 650 MG PO TBCR: 650.0000 mg | EXTENDED_RELEASE_TABLET | Freq: Two times a day (BID) | ORAL | 1 refills | Status: AC | PRN

## 2024-08-11 MED ORDER — TRIAMCINOLONE ACETONIDE 0.1 % EX CREA: 1.0000 | TOPICAL_CREAM | Freq: Two times a day (BID) | CUTANEOUS | 1 refills | Status: AC | PRN

## 2024-08-11 MED ORDER — DICLOFENAC SODIUM 1 % EX GEL: 4.0000 g | Freq: Four times a day (QID) | CUTANEOUS | 1 refills | Status: AC | PRN

## 2024-08-11 MED ORDER — ATORVASTATIN CALCIUM 40 MG PO TABS: 40.0000 mg | ORAL_TABLET | Freq: Every day | ORAL | 3 refills | Status: AC

## 2024-08-11 NOTE — Assessment & Plan Note (Signed)
-   12/25/19 saw [facility] Dr. Carolee with LUTS urinary urgency f/u prn prostate 50 grams symmetrical no nodules or ttp - Enlarged prostate on CT abdomen on 11/18/2021 which was done to evaluate for flank pain. Previously prescribed Flomax , patient stopped taking. Declines urinary symptoms.   - Patient today declines urinary symptoms, he self elected to stop taking Tamsulosin  0.4 mg as he did not feel like improvement in symptoms. Will continue to monitor, patient to reach out if symptoms worsens.

## 2024-08-11 NOTE — Patient Instructions (Addendum)
--   Start taking Atorvastatin  or Lipitor 40 mg at bedtime to help improve cholesterol and cholesterol related adverse health outcome.   -- For joint pain, stiffness: Take Tylenol  650 mg, twice a day as needed for pain and ache. You can also try Voltaren gel 1%, up to four times a day as needed for arthritis.   -- For itchy skin, mosquito bite: Use Triamcinolone  0.1% twice daily for 1-2 weeks as needed on mosquito bites, itchy spots but limit it to less than 2 weeks in a row.   -- Let me know know when you are ready for lung cancer screening with low dose CT of lungs and I will go ahead and order it.   -- I do not manage sleep apnea and CPAP. When you need CPAP supplies  you can reach out to pulmonology  Almarie LELON Ferrari, NP.   -- You are due for COVID-19, tetanus booster, shingles, influenza, pneumonia vaccine.  I recommend you update these through your local pharmacy.   If you are interested in the shingles vaccine series (Shingrix), call your insurance or pharmacy to check on coverage and location it must be given.  If affordable - you can schedule it here or at your pharmacy depending on coverage   -- If you restart smoking and feel like medications or nicotine replacement therapy would be beneficial for smoking cessation please schedule a follow up appointment in 2 months.  [facility] provides free cessation services to any [place]  resident who needs help quitting commercial tobacco use, which includes all tobacco products offered for sale, not tobacco used for sacred and traditional ceremonies by many [place] tribes and communities. Quit Coaching is available in different forms, which can be used separately or together, to help any tobacco user give up tobacco.  Get free tobacco cessation help 24/7 in several ways:  1-800-QUIT-NOW 859-405-9262); Espaol: 1-855-Djelo-Ya (1-508-492-8169) o para ms informacin haga clic aqu ; Interpretation services available  for many languages; Register online ( en espaol ) TTY: 873-448-2101 [place] Quitline: Call 888-7AI-QUIT 463-364-5588)

## 2024-08-11 NOTE — Assessment & Plan Note (Addendum)
 Goal LDL <70,  Recommend adhering to taking Atorvastatin  40 mg daily. New refill sent. Check cholesterol before next visit.

## 2024-08-11 NOTE — Assessment & Plan Note (Addendum)
 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    Also has pain involving knees.  This is chronic in nature. He takes Tylenol  650 mg BID prn for this. He was prescribed Meloxicam  in the past which he elected to stop taking.  Continue Tylenol  as above, refill sent.  Trial of Diclofenac 1% gel four times a day prn when pain is worse, refill sent to the pharmacy.
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
## 2024-08-11 NOTE — Assessment & Plan Note (Signed)
 Noted on multiple imaging, most recent: CT abdomen pelvis done for flank pain on 11/18/2021 with scattered calcification of aorta. CT calcium  score 12/02/2021 0.  Risk factor management discussed.  He has a h/o self discontinuation of statin prescribed to him in the past.  Goal LDL <70, restart Atorvastatin  40 mg daily, new refill sent and counseling on medication adherent done. F/U in 4 months, repeat lipid panel, CMP ordered.

## 2024-08-11 NOTE — Assessment & Plan Note (Signed)
 Chronic, patient asymptomatic, lungs auscultation b/l normal.  Counseled on smoking cessation. Currently not smoking, discussed pharmacological, non-pharmacological intervention for achieving continuous smoking cessation.

## 2024-08-11 NOTE — Assessment & Plan Note (Signed)
Continue f/u with pulmonology

## 2024-08-11 NOTE — Assessment & Plan Note (Addendum)
 Noted on previous lab, blood glucose in prediabetic range, will check A1c before his next visit with me. Lab ordered.

## 2024-08-11 NOTE — Assessment & Plan Note (Addendum)
 Current nicotine dependence with recent cessation attempt. Has not smoked for three days. Provided information on Quitline and other smoking cessation resources. Plan follow-up in four months to assess progress and discuss potential need for nicotine replacement therapy.  He is due for low dose CT lungs for lung cancer screening. He declines to update this today. He will reach out to us  if he changes his mind. Also established with pulmonology.

## 2024-08-11 NOTE — Assessment & Plan Note (Signed)
 Intermittent eruptions managed with triamcinolone  cream. No current rash. Cautioned against prolonged use to avoid skin thinning. Prescribe triamcinolone  0.1% cream for application twice a day for up to two weeks as needed.

## 2024-08-22 ENCOUNTER — Ambulatory Visit: Payer: Self-pay

## 2024-08-22 NOTE — Telephone Encounter (Signed)
 FYI Only or Action Required?: FYI only for provider.  Patient was last seen in primary care on 08/11/2024 by Bair, Kalpana, MD.  Called Nurse Triage reporting Facial Swelling.  Symptoms began several days ago.  Interventions attempted: Prescription medications: Kenalog  0.1% cream and Other: warm compress.  Symptoms are: left forehead suspected insect bite completely resolved followed by left upper and lower eyelid/below eye swelling with mild itchiness and left eye discharge (unable to describe color).  Triage Disposition: See PCP When Office is Open (Within 3 Days)  Patient/caregiver understands and will follow disposition?: Yes          Copied from CRM #[id number]. Topic: Clinical - Red Word Triage >> Aug 22, 2024 12:00 PM Dedra B wrote: [facility] that prompted transfer to Nurse Triage: Pt was bitten by something on the L side of his forehead and his eye his swollen. Warm transfer to NT. Reason for Disposition  [1] MILD eyelid swelling (puffiness) AND [2] persists > 3 days  (Exception: Suspect mosquito bites.)  Answer Assessment - Initial Assessment Questions Offered urgent care visit to be seen today as no availability in office or region left today. Patient declined and requested to be seen on Monday. Advised patient to call back and seek care sooner if symptoms worsen or he develops pain, fever, yellow or pus discharge from left eye.  1. ONSET: When did the swelling start? (e.g., minutes, hours, days)     X few days. Patient poor historian and unable to specify when it started.  2. LOCATION: What part of the eyelids is swollen?     He states it started on his right side, at first it was my right eye I got bit on that side and it went down. He states he requested a medication from his PCP which helped. He states now it is the left side and started as a bite on his left side of his forehead which has resolved. Upper eyelid and lower eyelid/below left eye/cheek  swollen.  3. SEVERITY: How swollen is it?     Patient is able to open the left eye and see out of it, not severe.  4. ITCHING: Is there any itching? If Yes, ask: How much?   (Scale 1-10; mild, moderate or severe)     Yes, little bit here and there. Patient states he tries to not scratch. He has been applying his Kenalog  0.1% cream.  5. PAIN: Is the swelling painful to touch? If Yes, ask: How painful is it?   (Scale 1-10; mild, moderate or severe)     No.  6. FEVER: Do you have a fever? If Yes, ask: What is it, how was it measured, and when did it start?      No.  7. CAUSE: What do you think is causing the swelling?     Unsure, he mentioned a bite to his left forehead prior to the facial swelling. He also mentioned CPAP machine which he is unsure if that is causing the swelling.  8. RECURRENT SYMPTOM: Have you had eyelid swelling before? If Yes, ask: When was the last time? What happened that time?     Similar episode, he was bitten by something/possibly an insect on his right side. Patient states he thinks it was 2023.  9. OTHER SYMPTOMS: Do you have any other symptoms? (e.g., blurred vision, eye discharge, rash, runny nose)     Left eye discharge from sleep this morning (unsure of color). Patient states he has had intermittent  SOB since stopping smoking cigarettes but states now he feels like he is getting past that.  10. PREGNANCY: Is there any chance you are pregnant? When was your last menstrual period?       N/A.  Protocols used: Eye - Swelling-A-AH

## 2024-08-22 NOTE — Telephone Encounter (Signed)
 LMTCB

## 2024-08-25 ENCOUNTER — Ambulatory Visit (INDEPENDENT_AMBULATORY_CARE_PROVIDER_SITE_OTHER)

## 2024-08-25 VITALS — BP 113/66 | HR 53 | Temp 97.7°F | Ht 68.0 in | Wt 169.8 lb

## 2024-08-25 DIAGNOSIS — S0086XA Insect bite (nonvenomous) of other part of head, initial encounter: Secondary | ICD-10-CM

## 2024-08-25 DIAGNOSIS — H02844 Edema of left upper eyelid: Secondary | ICD-10-CM

## 2024-08-25 DIAGNOSIS — W57XXXA Bitten or stung by nonvenomous insect and other nonvenomous arthropods, initial encounter: Secondary | ICD-10-CM | POA: Diagnosis not present

## 2024-08-25 DIAGNOSIS — R002 Palpitations: Secondary | ICD-10-CM | POA: Diagnosis not present

## 2024-08-25 MED ORDER — DOXYCYCLINE HYCLATE 100 MG PO TABS: 100.0000 mg | ORAL_TABLET | Freq: Two times a day (BID) | ORAL | 0 refills | Status: AC

## 2024-08-25 NOTE — Assessment & Plan Note (Signed)
 Patient with ~3 week hx of intermittent palpitations that started around the same time he quit smoking. Palpitations typically occur at rest. EKG obtained today with sinus bradycardia (HR 47), similar to previous EKG in 2022. Denies presyncopal episodes, chest pain, worsening dyspnea.  - Will obtain labs as below - Will refer patient to cardiology for further workup for palpitations and bradycardia

## 2024-08-25 NOTE — Progress Notes (Signed)
 Acute visit   Patient: Juan [name].   DOB: 02-10-1951   73 y.o. Male  MRN: [id number] PCP: Abbey Bruckner, MD   Chief Complaint  Patient presents with   Facial Swelling    Patient reports left sided eye/ eyelid, discharge x 5- 6 days. Thinks he may have been bitten by something that caused welling. States It was worse but has gotten better    Irregular Heart Beat    Patient reports this sensation of feeling the heart flutter and skip a beat. Reports that this has been ongoing 2-3 weeks.    Subjective    Discussed the use of AI scribe software for clinical note transcription with the patient, who gave verbal consent to proceed.  History of Present Illness Juan [name]. is a 73 year old male who presents with swelling and inflammation of the left eye following an insect bite and for heart palpitations.  He experienced swelling and inflammation of the left eye after being bitten by an insect, possibly a mosquito, while working in his yard last Tuesday or Wednesday. The swelling extended from the bite site on his forehead down to his left cheek and was accompanied by watery discharge from the eye, which dried up overnight. He has been using hydrocortisone cream and warm compresses to manage the swelling. The swelling has decreased but remains present, with occasional itching, especially when sitting still for extended periods. No pain is associated with the swelling. He wears a CPAP mask, which he believes may exacerbate the swelling due to the straps. He does not wear contact lenses and only uses reading glasses occasionally. 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 He denies any changes to his vision. Denies pain of the eyelid.  He also experiences heart palpitations, described as a fluttering sensation in his heart. The palpitations started around the same time that he quit smoking. These symptoms occur intermittently,  sometimes while sitting and watching a movie. He has recently stopped smoking, having not had a cigarette for three to four weeks. Denies chest pain, but sometimes has shortness of breath on exertion which is a chronic issue for him. He has been told that he had a slow heart rate in the past. Denies presyncopal episodes.  Review of systems as noted in HPI.   Objective    BP 113/66 (BP Location: Left Arm, Patient Position: Sitting, Cuff Size: Normal)   Pulse (!) 53   Temp 97.7 F (36.5 C) (Oral)   Ht 5' 8 (1.727 m)   Wt 169 lb 12.8 oz (77 kg)   SpO2 100%   BMI 25.82 kg/m  Physical Exam Constitutional:      Appearance: Normal appearance.  HENT:     Head: Normocephalic and atraumatic.     Mouth/Throat:     Mouth: Mucous membranes are moist.  Eyes:     General:        Left eye: No foreign body, discharge or hordeolum.     Extraocular Movements: Extraocular movements intact.     Conjunctiva/sclera: Conjunctivae normal.     Pupils: Pupils are equal, round, and reactive to light.     Comments: L upper eyelid with mild erythema, edema and warmth, no TTP. No pain with eye movements. No visual impairment.  Cardiovascular:     Rate and Rhythm: Regular rhythm. Bradycardia present.  Pulmonary:     Effort: Pulmonary effort is normal.  Breath sounds: Normal breath sounds.  Skin:    General: Skin is warm.     Comments: Hyperpigmented papule present just above L temple  Neurological:     General: No focal deficit present.     Mental Status: He is alert.      No results found for any visits on 08/25/24.  Assessment & Plan     Problem List Items Addressed This Visit       Other   Swelling of left upper eyelid   Patient with 5 days of L upper eyelid swelling after insect bite to L forehead, which has been improving with use of warm compresses. On exam, patient has mild edema, erythema and warmth to L upper eyelid, but no pain present. DDx includes preseptal cellulitis vs. Local  allergic reaction. No stye or discharge present. Low concern for orbital cellulitis given no eye pain, no pain with EOM, normal vision. - Will treat with doxycycline BID x 7 days - Continue warm compresses and Pataday eye drops - Strict return precautions given      Relevant Medications   doxycycline (VIBRA-TABS) 100 MG tablet   Palpitations - Primary   Patient with ~3 week hx of intermittent palpitations that started around the same time he quit smoking. Palpitations typically occur at rest. EKG obtained today with sinus bradycardia (HR 47), similar to previous EKG in 2022. Denies presyncopal episodes, chest pain, worsening dyspnea.  - Will obtain labs as below - Will refer patient to cardiology for further workup for palpitations and bradycardia      Relevant Orders   CBC   Comprehensive metabolic panel with GFR   TSH   Magnesium   Ambulatory referral to Cardiology   EKG 12-Lead   Other Visit Diagnoses       Insect bite of other part of head, initial encounter       Relevant Medications   doxycycline (VIBRA-TABS) 100 MG tablet       Meds ordered this encounter  Medications   doxycycline (VIBRA-TABS) 100 MG tablet    Sig: Take 1 tablet (100 mg total) by mouth 2 (two) times daily for 7 days.    Dispense:  14 tablet    Refill:  0     Follow up with PCP.      Isaiah DELENA Pepper, MD  [facility] [phone] (phone) [phone] (fax)

## 2024-08-25 NOTE — Patient Instructions (Signed)
 I recommend warm compresses and Pataday eye drops to help with itchiness in your eye.

## 2024-08-25 NOTE — Assessment & Plan Note (Addendum)
 Patient with 5 days of L upper eyelid swelling after insect bite to L forehead, which has been improving with use of warm compresses. On exam, patient has mild edema, erythema and warmth to L upper eyelid, but no pain present. DDx includes preseptal cellulitis vs. Local allergic reaction. No stye or discharge present. Low concern for orbital cellulitis given no eye pain, no pain with EOM, normal vision. - Will treat with doxycycline BID x 7 days - Continue warm compresses and Pataday eye drops - Strict return precautions given

## 2024-08-26 ENCOUNTER — Ambulatory Visit: Payer: Self-pay

## 2024-08-26 LAB — COMPREHENSIVE METABOLIC PANEL WITH GFR
ALT: 11 IU/L (ref 0–44)
AST: 17 IU/L (ref 0–40)
Albumin: 4 g/dL (ref 3.8–4.8)
Alkaline Phosphatase: 85 IU/L (ref 47–123)
BUN/Creatinine Ratio: 22 (ref 10–24)
BUN: 17 mg/dL (ref 8–27)
Bilirubin Total: 0.3 mg/dL (ref 0.0–1.2)
CO2: 25 mmol/L (ref 20–29)
Calcium: 9.9 mg/dL (ref 8.6–10.2)
Chloride: 103 mmol/L (ref 96–106)
Creatinine, Ser: 0.77 mg/dL (ref 0.76–1.27)
Globulin, Total: 3 g/dL (ref 1.5–4.5)
Glucose: 92 mg/dL (ref 70–99)
Potassium: 4.6 mmol/L (ref 3.5–5.2)
Sodium: 142 mmol/L (ref 134–144)
Total Protein: 7 g/dL (ref 6.0–8.5)
eGFR: 94 mL/min/1.73 (ref >59)

## 2024-08-26 LAB — CBC
Hematocrit: 38.5 % (ref 37.5–51.0)
Hemoglobin: 13.1 g/dL (ref 13.0–17.7)
MCH: 33.6 pg — ABNORMAL HIGH (ref 26.6–33.0)
MCHC: 34 g/dL (ref 31.5–35.7)
MCV: 99 fL — ABNORMAL HIGH (ref 79–97)
Platelets: 146 x10E3/uL — ABNORMAL LOW (ref 150–450)
RBC: 3.9 x10E6/uL — ABNORMAL LOW (ref 4.14–5.80)
RDW: 14.6 % (ref 11.6–15.4)
WBC: 6.9 x10E3/uL (ref 3.4–10.8)

## 2024-08-26 LAB — TSH: TSH: 4.09 u[IU]/mL (ref 0.450–4.500)

## 2024-08-26 LAB — MAGNESIUM: Magnesium: 2.2 mg/dL (ref 1.6–2.3)

## 2024-08-29 ENCOUNTER — Encounter: Payer: Self-pay | Admitting: Student

## 2024-08-29 ENCOUNTER — Ambulatory Visit (INDEPENDENT_AMBULATORY_CARE_PROVIDER_SITE_OTHER)

## 2024-08-29 ENCOUNTER — Ambulatory Visit: Attending: Student | Admitting: Physician Assistant

## 2024-08-29 VITALS — BP 118/66 | HR 49 | Ht 68.5 in | Wt 169.8 lb

## 2024-08-29 DIAGNOSIS — R002 Palpitations: Secondary | ICD-10-CM | POA: Diagnosis not present

## 2024-08-29 DIAGNOSIS — I700 Atherosclerosis of aorta: Secondary | ICD-10-CM

## 2024-08-29 DIAGNOSIS — E785 Hyperlipidemia, unspecified: Secondary | ICD-10-CM | POA: Diagnosis not present
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
## 2024-08-29 NOTE — Patient Instructions (Signed)
 Medication Instructions:  Your physician recommends that you continue on your current medications as directed. Please refer to the Current Medication list given to you today.   *If you need a refill on your cardiac medications before your next appointment, please call your pharmacy*  Lab Work: No labs ordered today  If you have labs (blood work) drawn today and your tests are completely normal, you will receive your results only by: MyChart Message (if you have MyChart) OR A paper copy in the mail If you have any lab test that is abnormal or we need to change your treatment, we will call you to review the results.  Testing/Procedures: Your physician has requested that you have an echocardiogram. Echocardiography is a painless test that uses sound waves to create images of your heart. It provides your doctor with information about the size and shape of your heart and how well your heart's chambers and valves are working.   You may receive an ultrasound enhancing agent through an IV if needed to better visualize your heart during the echo. This procedure takes approximately one hour.  There are no restrictions for this procedure.  This will take place at [address] [facility] [address]) [address], [place] [address]  Please note: We ask at that you not bring children with you during ultrasound (echo/ vascular) testing. Due to room size and safety concerns, children are not allowed in the ultrasound rooms during exams. Our front office staff cannot provide observation of children in our lobby area while testing is being conducted. An adult accompanying a patient to their appointment will only be allowed in the ultrasound room at the discretion of the ultrasound technician under special circumstances. We apologize for any inconvenience.   ZIO XT- Long Term Monitor Instructions  Your physician has requested you wear a ZIO patch monitor for 14 days.  This is a single patch monitor. [employer]  supplies one patch monitor per enrollment. Additional stickers are not available. Please do not apply patch if you will be having a Nuclear Stress Test,  Echocardiogram, Cardiac CT, MRI, or Chest Xray during the period you would be wearing the  monitor. The patch cannot be worn during these tests. You cannot remove and re-apply the  ZIO XT patch monitor.  Your ZIO patch monitor will be mailed 3 day USPS to your address on file. It may take 3-5 days  to receive your monitor after you have been enrolled.  Once you have received your monitor, please review the enclosed instructions. Your monitor  has already been registered assigning a specific monitor serial # to you.  Billing and Patient Assistance Program Information  We have supplied [employer] with any of your insurance information on file for billing purposes. [employer] offers a sliding scale Patient Assistance Program for patients that do not have  insurance, or whose insurance does not completely cover the cost of the ZIO monitor.  You must apply for the Patient Assistance Program to qualify for this discounted rate.  To apply, please call [employer] at [phone], select option 4, select option 2, ask to apply for  Patient Assistance Program. Meredeth will ask your household income, and how many people  are in your household. They will quote your out-of-pocket cost based on that information.  [employer] will also be able to set up a 51-month, interest-free payment plan if needed.  Applying the monitor   Shave hair from upper left chest.  Hold abrader disc by orange tab. Rub abrader in  40 strokes over the upper left chest as  indicated in your monitor instructions.  Clean area with 4 enclosed alcohol pads. Let dry.  Apply patch as indicated in monitor instructions. Patch will be placed under collarbone on left  side of chest with arrow pointing upward.  Rub patch adhesive wings for 2 minutes. Remove white label marked 1. Remove the white   label marked 2. Rub patch adhesive wings for 2 additional minutes.  While looking in a mirror, press and release button in center of patch. A small green light will  flash 3-4 times. This will be your only indicator that the monitor has been turned on.  Do not shower for the first 24 hours. You may shower after the first 24 hours.  Press the button if you feel a symptom. You will hear a small click. Record Date, Time and  Symptom in the Patient Logbook.  When you are ready to remove the patch, follow instructions on the last 2 pages of Patient  Logbook. Stick patch monitor onto the last page of Patient Logbook.  Place Patient Logbook in the blue and white box. Use locking tab on box and tape box closed  securely. The blue and white box has prepaid postage on it. Please place it in the mailbox as  soon as possible. Your physician should have your test results approximately 7 days after the  monitor has been mailed back to [facility].  Call [facility] Customer Care at [phone] if you have questions regarding  your ZIO XT patch monitor. Call them immediately if you see an orange light blinking on your  monitor.  If your monitor falls off in less than 4 days, contact our Monitor department at [phone].  If your monitor becomes loose or falls off after 4 days call [employer] at [phone] for  suggestions on securing your monitor   Follow-Up: At [facility], you and your health needs are our priority.  As part of our continuing mission to provide you with exceptional heart care, our providers are all part of one team.  This team includes your primary Cardiologist (physician) and Advanced Practice Providers or APPs (Physician Assistants and Nurse Practitioners) who all work together to provide you with the care you need, when you need it.  Your next appointment:   4 - 6 week(s)  Provider:   You may see one of the following Advanced Practice Providers on your  designated Care Team:   Lonni Meager, NP Lesley Maffucci, PA-C Bernardino Bring, PA-C Cadence Kent Narrows, PA-C Tylene Lunch, NP Barnie Hila, NP   We recommend signing up for the patient portal called MyChart.  Sign up information is provided on this After Visit Summary.  MyChart is used to connect with patients for Virtual Visits (Telemedicine).  Patients are able to view lab/test results, encounter notes, upcoming appointments, etc.  Non-urgent messages can be sent to your provider as well.   To learn more about what you can do with MyChart, go to [email or web address].

## 2024-08-29 NOTE — Progress Notes (Signed)
 Email sent to [employer] to change delivery to POB for patient  [address] [place], [place] [address]

## 2024-09-16 ENCOUNTER — Telehealth: Payer: Self-pay | Admitting: Cardiovascular Disease

## 2024-09-16 NOTE — Telephone Encounter (Signed)
 Pt received two heart monitors. He called the monitor company and was told to reach out to his doctor. Please advise.

## 2024-09-16 NOTE — Telephone Encounter (Signed)
 Called patient and left message for call back.

## 2024-09-18 NOTE — Telephone Encounter (Signed)
Attempted to contact pt. Unable to leave message as mailbox is full.  °

## 2024-09-19 NOTE — Telephone Encounter (Signed)
 Patient returned call  - advised to bring second monitor back to office, also needing help applying monitor - advised pt to shave the left upper part of his chest prior to coming in, Patient verbalized understanding - he will call the office prior to coming in to apply monitor

## 2024-09-19 NOTE — Telephone Encounter (Signed)
 Called patient - left message on VM to return call to the office

## 2024-09-22 ENCOUNTER — Telehealth: Payer: Self-pay

## 2024-09-22 ENCOUNTER — Ambulatory Visit (INDEPENDENT_AMBULATORY_CARE_PROVIDER_SITE_OTHER)

## 2024-09-22 VITALS — BP 110/60 | HR 62 | Temp 98.1°F | Wt 173.0 lb

## 2024-09-22 DIAGNOSIS — Z4802 Encounter for removal of sutures: Secondary | ICD-10-CM | POA: Diagnosis not present

## 2024-09-22 DIAGNOSIS — S62327A Displaced fracture of shaft of fifth metacarpal bone, left hand, initial encounter for closed fracture: Secondary | ICD-10-CM

## 2024-09-22 NOTE — Assessment & Plan Note (Signed)
 Right index finger with 5 sutures in place. Verbal consent obtained to remove sutures today. Sutures removed, patient tolerated it well.  Advised to keep area clean and dry for 12 hours.  Instructed to monitor for infection signs and f/u if signs of infection occurs.

## 2024-09-22 NOTE — Telephone Encounter (Signed)
 Noted. Dr Abbey mentioned to patient that he needed to update any contact information regarding his son in case his voicemail is full.

## 2024-09-22 NOTE — Telephone Encounter (Signed)
 Patient forgot to tell Dr Abbey if the office can not reach him to call his son, Ronalee.

## 2024-09-22 NOTE — Patient Instructions (Addendum)
 Keep the area clean and dry for the first 24 hours. Watch for signs of infection and follow up if following develops: redness, swelling, warmth, drainage, or increased pain.   For left hand, finger  fracture:  You can walk to following address and schedule an appointment with orthopedic doctor at:  [facility] [address] [place], [place], [place] [address] Phone number:  [phone]   Please reach out to cardiology clinic to set up appointment to place zio or heart monitor in place and return the extra monitor.

## 2024-09-22 NOTE — Telephone Encounter (Signed)
 Noted

## 2024-09-22 NOTE — Assessment & Plan Note (Addendum)
 [facility] ED visit on 09/16/24, x-ray left: Left sided Oblique fracture of the fifth metacarpal shaft with lateral displacement and mild foreshortening. He was placed on splint with ortho referral.  No f/u with orthopedic department till now. I discussed with the patient that he needs to follow up with orthopedic doctor to further evaluate the left hand. We discussed various options including him going to [facility] after his visit with me now. I printed address and phone number for the patient to [facility], he plans on going thereafter his appointment with me. He understands seriousness of fracture and specialist care in management.
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
## 2024-09-23 NOTE — Telephone Encounter (Signed)
 Patient calling to see if he can come in tomorrow to get his monitor put on. Please advise

## 2024-09-23 NOTE — Telephone Encounter (Signed)
 Pt returned phone call; verified patient identity using two identifiers; pt states that he will be able to come Wednesday morning around 10:00 am to have Zio Heart Monitor placed.  Pt received two monitors.  He states that he will bring them both, one to be placed and one to have returned.  I explained that he can walk in and a nurse would assist placing the monitor on.

## 2024-09-23 NOTE — Telephone Encounter (Signed)
 Called patient back to schedule a nurse visit to place Zio Heart Monitor; no answer; left detailed message for him to call our office back to schedule his nurse visit.

## 2024-10-02 ENCOUNTER — Telehealth: Payer: Self-pay

## 2024-10-02 NOTE — Telephone Encounter (Signed)
 Noted.  Jacklin Mascot, MD

## 2024-10-02 NOTE — Telephone Encounter (Signed)
 Copied from CRM #[id number]. Topic: General - Other >> Oct 02, 2024 12:33 PM Pinkey ORN wrote: Reason for CRM: Abbey Bruckner, MD >> Oct 02, 2024 12:34 PM Pinkey ORN wrote: Patient states he followed up and completed all of the requested tasks, in regards to his heart monitor and his broken hand.

## 2024-10-06 ENCOUNTER — Ambulatory Visit: Admitting: Primary Care

## 2024-10-06 DIAGNOSIS — G4733 Obstructive sleep apnea (adult) (pediatric): Secondary | ICD-10-CM

## 2024-10-06 DIAGNOSIS — J439 Emphysema, unspecified: Secondary | ICD-10-CM

## 2024-10-16 ENCOUNTER — Ambulatory Visit

## 2024-10-16 ENCOUNTER — Ambulatory Visit: Admitting: Sleep Medicine

## 2024-10-16 ENCOUNTER — Other Ambulatory Visit: Payer: Self-pay | Admitting: Physician Assistant

## 2024-10-16 ENCOUNTER — Ambulatory Visit: Payer: Self-pay | Admitting: Physician Assistant

## 2024-10-16 DIAGNOSIS — I700 Atherosclerosis of aorta: Secondary | ICD-10-CM

## 2024-10-16 DIAGNOSIS — E785 Hyperlipidemia, unspecified: Secondary | ICD-10-CM

## 2024-10-16 DIAGNOSIS — R002 Palpitations: Secondary | ICD-10-CM

## 2024-10-17 ENCOUNTER — Encounter: Payer: Self-pay | Admitting: Emergency Medicine

## 2024-10-20 ENCOUNTER — Ambulatory Visit: Attending: Physician Assistant

## 2024-10-20 DIAGNOSIS — R002 Palpitations: Secondary | ICD-10-CM

## 2024-10-20 LAB — ECHOCARDIOGRAM COMPLETE
AR max vel: 1.61 cm2
AV Area VTI: 1.69 cm2
AV Area mean vel: 1.58 cm2
AV Mean grad: 5 mmHg
AV Peak grad: 9.5 mmHg
Ao pk vel: 1.54 m/s
Area-P 1/2: 3.21 cm2
S' Lateral: 3.1 cm

## 2024-10-21 ENCOUNTER — Encounter: Payer: Self-pay | Admitting: Physician Assistant

## 2024-10-21 ENCOUNTER — Ambulatory Visit: Attending: Physician Assistant | Admitting: Physician Assistant

## 2024-10-21 ENCOUNTER — Encounter: Payer: Self-pay | Admitting: Sleep Medicine

## 2024-10-21 ENCOUNTER — Ambulatory Visit: Admitting: Sleep Medicine

## 2024-10-21 VITALS — BP 160/98 | HR 65 | Ht 68.0 in | Wt 173.6 lb

## 2024-10-21 VITALS — BP 130/84 | HR 66 | Temp 98.5°F | Ht 68.0 in | Wt 173.0 lb

## 2024-10-21 DIAGNOSIS — E785 Hyperlipidemia, unspecified: Secondary | ICD-10-CM

## 2024-10-21 DIAGNOSIS — R002 Palpitations: Secondary | ICD-10-CM

## 2024-10-21 DIAGNOSIS — E782 Mixed hyperlipidemia: Secondary | ICD-10-CM

## 2024-10-21 DIAGNOSIS — G4733 Obstructive sleep apnea (adult) (pediatric): Secondary | ICD-10-CM

## 2024-10-21 DIAGNOSIS — I700 Atherosclerosis of aorta: Secondary | ICD-10-CM
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
## 2024-10-21 NOTE — Patient Instructions (Signed)
 Medication Instructions:  Your physician recommends that you continue on your current medications as directed. Please refer to the Current Medication list given to you today.  *If you need a refill on your cardiac medications before your next appointment, please call your pharmacy*  Lab Work: No labs ordered today  If you have labs (blood work) drawn today and your tests are completely normal, you will receive your results only by: MyChart Message (if you have MyChart) OR A paper copy in the mail If you have any lab test that is abnormal or we need to change your treatment, we will call you to review the results.  Testing/Procedures: No test ordered today   Follow-Up: At [facility], you and your health needs are our priority.  As part of our continuing mission to provide you with exceptional heart care, our providers are all part of one team.  This team includes your primary Cardiologist (physician) and Advanced Practice Providers or APPs (Physician Assistants and Nurse Practitioners) who all work together to provide you with the care you need, when you need it.  Your next appointment:   1 year(s)  Provider:   You may see  or one of the following Advanced Practice Providers on your designated Care Team:   Lonni Meager, NP Lesley Maffucci, PA-C Bernardino Bring, PA-C Cadence [place], PA-C Tylene Lunch, NP Barnie Hila, NP    We recommend signing up for the patient portal called MyChart.  Sign up information is provided on this After Visit Summary.  MyChart is used to connect with patients for Virtual Visits (Telemedicine).  Patients are able to view lab/test results, encounter notes, upcoming appointments, etc.  Non-urgent messages can be sent to your provider as well.   To learn more about what you can do with MyChart, go to [email or web address].
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
## 2024-10-21 NOTE — Patient Instructions (Signed)
 Patient Instructions  Continue to use CPAP every night, minimum of 7-8 hours a night.  Change equipment every 30 days or as directed by DME.  Wash your tubing with warm soap and water hang to dry. Wash humidifier portion weekly. Use distilled water and change daily   Be aware of reduced alertness and do not drive or operate heavy machinery if experiencing this or drowsiness.  Exercise encouraged, as tolerated. Encouraged proper weight management.  Important to get eight or more hours of sleep  Limiting the use of the computer and television before bedtime.  Decrease naps during the day, so night time sleep will become enhanced.  Limit caffeine, and sleep deprivation.  HTN, stroke, uncontrolled diabetes and heart failure are potential risk factors.  Risk of untreated sleep apnea including cardiac arrhthymias, stroke, DM, pulm HTN.

## 2024-10-23 ENCOUNTER — Encounter: Payer: Self-pay | Admitting: Emergency Medicine

## 2024-11-11 ENCOUNTER — Telehealth: Payer: Self-pay

## 2024-11-11 NOTE — Telephone Encounter (Signed)
 Copied from CRM [id number]. Topic: Clinical - Medical Advice >> Nov 11, 2024  2:02 PM Isabell A wrote: Reason for CRM: Patient is requesting the information he was given at his last visit on 12/9 - in regard to his CPAP machine. Requesting for it to be mailed to:    [address] [place], [place] [address]     Patient is also requesting a call back.

## 2024-11-11 NOTE — Telephone Encounter (Signed)
 Downloaded CPAP data usage and will mail to Pt.

## 2024-12-29 ENCOUNTER — Other Ambulatory Visit

## 2025-01-05 ENCOUNTER — Ambulatory Visit

## 2025-01-19 ENCOUNTER — Ambulatory Visit: Admitting: Sleep Medicine

## 2025-04-15 ENCOUNTER — Ambulatory Visit
